# Patient Record
Sex: Female | Born: 1985 | Race: Black or African American | Hispanic: No | Marital: Single | Smoking: Former smoker
Health system: Southern US, Community
[De-identification: ages and names within clinical notes are randomized; demographics above are authoritative.]

## PROBLEM LIST (undated history)

## (undated) DIAGNOSIS — N189 Chronic kidney disease, unspecified: Secondary | ICD-10-CM

## (undated) DIAGNOSIS — R51 Headache: Secondary | ICD-10-CM

## (undated) HISTORY — PX: TUBAL LIGATION: SHX77

---
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
## 2009-11-17 ENCOUNTER — Other Ambulatory Visit: Admission: RE | Admit: 2009-11-17 | Discharge: 2009-11-17 | Payer: Self-pay | Admitting: *Deleted

## 2009-11-17 ENCOUNTER — Other Ambulatory Visit
Admission: RE | Admit: 2009-11-17 | Discharge: 2009-11-17 | Payer: Self-pay | Source: Home / Self Care | Admitting: Internal Medicine

## 2010-08-24 ENCOUNTER — Other Ambulatory Visit (HOSPITAL_COMMUNITY)
Admission: RE | Admit: 2010-08-24 | Discharge: 2010-08-24 | Disposition: A | Discharge status: Home / Self Care | Payer: BC Managed Care – PPO | Source: Ambulatory Visit | Attending: Obstetrics and Gynecology | Admitting: Obstetrics and Gynecology

## 2010-08-24 DIAGNOSIS — Z124 Encounter for screening for malignant neoplasm of cervix: Secondary | ICD-10-CM | POA: Insufficient documentation

## 2010-08-24 DIAGNOSIS — Z113 Encounter for screening for infections with a predominantly sexual mode of transmission: Secondary | ICD-10-CM | POA: Insufficient documentation

## 2010-08-24 DIAGNOSIS — Z1159 Encounter for screening for other viral diseases: Secondary | ICD-10-CM | POA: Insufficient documentation

## 2010-11-26 ENCOUNTER — Other Ambulatory Visit: Payer: Self-pay | Admitting: Obstetrics and Gynecology

## 2010-12-28 ENCOUNTER — Encounter (HOSPITAL_COMMUNITY): Payer: Self-pay | Admitting: Pharmacist

## 2011-01-14 ENCOUNTER — Encounter (HOSPITAL_COMMUNITY): Payer: Self-pay

## 2011-01-14 ENCOUNTER — Encounter (HOSPITAL_COMMUNITY)
Admission: RE | Admit: 2011-01-14 | Discharge: 2011-01-14 | Disposition: A | Discharge status: Home / Self Care | Payer: BC Managed Care – PPO | Source: Ambulatory Visit | Attending: Obstetrics and Gynecology | Admitting: Obstetrics and Gynecology

## 2011-01-14 HISTORY — DX: Headache: R51

## 2011-01-14 LAB — CBC
HCT: 35.8 % — ABNORMAL LOW (ref 36.0–46.0)
MCHC: 32.4 g/dL (ref 30.0–36.0)
MCV: 90.6 fL (ref 78.0–100.0)
RDW: 13.7 % (ref 11.5–15.5)

## 2011-01-14 NOTE — Patient Instructions (Addendum)
   Your procedure is scheduled ZO:XWRUEAVW January 10th  Enter through the Main Entrance of [facility] at: 9am Pick up the phone at the desk and dial [phone] and inform us of your arrival.  Please call this number if you have any problems the morning of surgery: [phone]  Remember: Do not eat food after midnight: Wednesday Do not drink clear liquids after:midnight Wednesday Take these medicines the morning of surgery with a SIP OF WATER: none  Do not wear jewelry, make-up, or FINGER nail polish Do not wear lotions, powders, perfumes or deodorant. Do not shave 48 hours prior to surgery. Do not bring valuables to the hospital.    Patients discharged on the day of surgery will not be allowed to drive home.     Remember to use your hibiclens as instructed.Please shower with 1/2 bottle the evening before your surgery and the other 1/2 bottle the morning of surgery.

## 2011-01-15 NOTE — H&P (Addendum)
NAMEAVIENDHA, AZBELL            ACCOUNT NO.:  [id number]  MEDICAL RECORD NO.:  [id number]  LOCATION:  [facility]                         FACILITY:  [facility]  PHYSICIAN:  Janine Limbo, M.D.DATE OF BIRTH:  Mar 02, 1985  DATE OF ADMISSION:  01/17/2011 DATE OF DISCHARGE:                               HISTORY & PHYSICAL   HISTORY OF PRESENT ILLNESS:  The patient is a 26 year old female, para 1- 1-0-2, who presents for a diagnostic laparoscopy.  The patient has been followed at the [facility] of [facility].  She complains of increasing dyspareunia and pelvic pain.  The patient has a known history of endometriosis diagnosed by laparoscopy that was performed in [place] many years ago. Her evaluation in [place] included a negative gonorrhea and Chlamydia culture.  An ultrasound showed a 6.8 x 4.8 cm uterus.  The ovaries appeared normal.  No other masses were appreciated on ultrasound.  The patient was treated empirically with metronidazole and azithromycin. She reports that there was no improvement in her discomfort.  The patient has been treated in the past with Depo-Lupron.  The patient has had a cesarean delivery in the past.  OBSTETRICAL HISTORY:  The patient has had 1 full term and 1 preterm delivery.  She has a history of an incompetent cervix.  DRUG ALLERGIES:  PENICILLIN.  PAST SURGERY HISTORY:  The patient has had her wisdom teeth removed and laparoscopy as mentioned above.  The patient has had 2 cesarean deliveries as well as a tubal sterilization procedure.  CURRENT MEDICATIONS:  Topamax which she takes for headaches.  She has a past history of asthma.  SOCIAL HISTORY:  The patient drinks alcohol socially.  She denies cigarette use and other recreational drug uses.  REVIEW OF SYSTEMS:  Please see history of present illness.  FAMILY HISTORY:  Noncontributory.  PHYSICAL EXAMINATION:  VITAL SIGNS:  Her height is  5 feet 2 inches, weight is 147 pounds. HEENT:  Within normal limits. CHEST:  Clear. HEART:  Regular rate and rhythm. BREASTS:  Without masses. ABDOMEN:  Nontender. EXTREMITIES:  Grossly normal. NEUROLOGIC:  Normal. PELVIC:  External genitalia is normal.  Vagina is normal.  Cervix is nontender.  Uterus is normal size, shape, and consistency.  Adnexa, no masses.  ASSESSMENT: 1. Increasing dyspareunia. 2. Pelvic pain 3. History of endometriosis.  PLAN:  The patient will undergo a diagnostic laparoscopy.  She understands the indications for her surgical procedure and she accepts the risks of, but not limited to, anesthetic complications, bleeding, infections, and possible damage to surrounding organs.  She understands that no guarantees can be given concerning the total relief of her discomfort.     Janine Limbo, M.D.     AVS/MEDQ  D:  01/14/2011  T:  01/15/2011  Job:  [id number]

## 2011-01-17 ENCOUNTER — Encounter (HOSPITAL_COMMUNITY): Payer: Self-pay | Admitting: Anesthesiology

## 2011-01-17 ENCOUNTER — Encounter (HOSPITAL_COMMUNITY)
Admission: RE | Disposition: A | Discharge status: Home / Self Care | Payer: Self-pay | Source: Ambulatory Visit | Attending: Obstetrics and Gynecology

## 2011-01-17 ENCOUNTER — Encounter (HOSPITAL_COMMUNITY): Payer: Self-pay | Admitting: *Deleted

## 2011-01-17 ENCOUNTER — Ambulatory Visit (HOSPITAL_COMMUNITY)
Admission: RE | Admit: 2011-01-17 | Discharge: 2011-01-17 | Disposition: A | Discharge status: Home / Self Care | Payer: BC Managed Care – PPO | Source: Ambulatory Visit | Attending: Obstetrics and Gynecology | Admitting: Obstetrics and Gynecology

## 2011-01-17 ENCOUNTER — Ambulatory Visit (HOSPITAL_COMMUNITY): Payer: BC Managed Care – PPO | Admitting: Anesthesiology

## 2011-01-17 DIAGNOSIS — N736 Female pelvic peritoneal adhesions (postinfective): Secondary | ICD-10-CM | POA: Insufficient documentation

## 2011-01-17 DIAGNOSIS — Z9851 Tubal ligation status: Secondary | ICD-10-CM | POA: Insufficient documentation

## 2011-01-17 DIAGNOSIS — N803 Endometriosis of pelvic peritoneum, unspecified: Secondary | ICD-10-CM | POA: Insufficient documentation

## 2011-01-17 DIAGNOSIS — D649 Anemia, unspecified: Secondary | ICD-10-CM | POA: Insufficient documentation

## 2011-01-17 DIAGNOSIS — N949 Unspecified condition associated with female genital organs and menstrual cycle: Secondary | ICD-10-CM | POA: Insufficient documentation

## 2011-01-17 DIAGNOSIS — IMO0002 Reserved for concepts with insufficient information to code with codable children: Secondary | ICD-10-CM | POA: Insufficient documentation

## 2011-01-17 HISTORY — PX: LAPAROSCOPY: SHX197

## 2011-01-17 SURGERY — LAPAROTOMY, FOR LYSIS OF ADHESIONS
Anesthesia: General | Site: Abdomen | Wound class: Clean Contaminated

## 2011-01-17 MED ORDER — PROMETHAZINE HCL 12.5 MG PO TABS: 12.5000 mg | ORAL_TABLET | Freq: Four times a day (QID) | ORAL | Status: AC | PRN

## 2011-01-17 MED ORDER — NEOSTIGMINE METHYLSULFATE 1 MG/ML IJ SOLN
INTRAMUSCULAR | Status: DC | PRN
  Administered 2011-01-17: 2 mg via INTRAVENOUS

## 2011-01-17 MED ORDER — LACTATED RINGERS IV SOLN
INTRAVENOUS | Status: DC
  Administered 2011-01-17 (×3): via INTRAVENOUS

## 2011-01-17 MED ORDER — ONDANSETRON HCL 4 MG/2ML IJ SOLN
INTRAMUSCULAR | Status: DC | PRN
  Administered 2011-01-17: 4 mg via INTRAVENOUS

## 2011-01-17 MED ORDER — OXYCODONE-ACETAMINOPHEN 5-325 MG PO TABS
ORAL_TABLET | ORAL | Status: AC
  Administered 2011-01-17: 1 via ORAL
  Filled 2011-01-17: qty 1

## 2011-01-17 MED ORDER — KETOROLAC TROMETHAMINE 30 MG/ML IJ SOLN
INTRAMUSCULAR | Status: DC | PRN
  Administered 2011-01-17: 30 mg via INTRAVENOUS
  Administered 2011-01-17: 30 mg via INTRAMUSCULAR

## 2011-01-17 MED ORDER — DEXAMETHASONE SODIUM PHOSPHATE 4 MG/ML IJ SOLN
INTRAMUSCULAR | Status: DC | PRN
  Administered 2011-01-17: 6 mg via INTRAVENOUS

## 2011-01-17 MED ORDER — FENTANYL CITRATE 0.05 MG/ML IJ SOLN
INTRAMUSCULAR | Status: DC | PRN
  Administered 2011-01-17: 100 ug via INTRAVENOUS
  Administered 2011-01-17: 50 ug via INTRAVENOUS
  Administered 2011-01-17 (×2): 100 ug via INTRAVENOUS

## 2011-01-17 MED ORDER — LIDOCAINE HCL (CARDIAC) 20 MG/ML IV SOLN
INTRAVENOUS | Status: AC
  Filled 2011-01-17: qty 5

## 2011-01-17 MED ORDER — IBUPROFEN 200 MG PO TABS: 800.0000 mg | ORAL_TABLET | Freq: Three times a day (TID) | ORAL | Status: AC | PRN

## 2011-01-17 MED ORDER — FENTANYL CITRATE 0.05 MG/ML IJ SOLN
INTRAMUSCULAR | Status: AC
  Administered 2011-01-17: 25 ug via INTRAVENOUS
  Filled 2011-01-17: qty 2

## 2011-01-17 MED ORDER — METOCLOPRAMIDE HCL 5 MG/ML IJ SOLN: 10.0000 mg | Freq: Once | INTRAMUSCULAR | Status: DC | PRN

## 2011-01-17 MED ORDER — ONDANSETRON HCL 4 MG/2ML IJ SOLN
INTRAMUSCULAR | Status: AC
  Filled 2011-01-17: qty 2

## 2011-01-17 MED ORDER — OXYCODONE-ACETAMINOPHEN 5-325 MG PO TABS: 1.0000 | ORAL_TABLET | ORAL | Status: AC | PRN

## 2011-01-17 MED ORDER — GLYCOPYRROLATE 0.2 MG/ML IJ SOLN
INTRAMUSCULAR | Status: AC
  Filled 2011-01-17: qty 2

## 2011-01-17 MED ORDER — FENTANYL CITRATE 0.05 MG/ML IJ SOLN
INTRAMUSCULAR | Status: AC
  Filled 2011-01-17: qty 5

## 2011-01-17 MED ORDER — GLYCOPYRROLATE 0.2 MG/ML IJ SOLN
INTRAMUSCULAR | Status: DC | PRN
  Administered 2011-01-17: .8 mg via INTRAVENOUS

## 2011-01-17 MED ORDER — MIDAZOLAM HCL 2 MG/2ML IJ SOLN
INTRAMUSCULAR | Status: AC
  Filled 2011-01-17: qty 2

## 2011-01-17 MED ORDER — MEPERIDINE HCL 25 MG/ML IJ SOLN
6.2500 mg | INTRAMUSCULAR | Status: DC | PRN
Start: 2011-01-17 — End: 2011-01-17

## 2011-01-17 MED ORDER — OXYCODONE-ACETAMINOPHEN 5-325 MG PO TABS
1.0000 | ORAL_TABLET | ORAL | Status: DC | PRN
  Administered 2011-01-17: 1 via ORAL

## 2011-01-17 MED ORDER — FENTANYL CITRATE 0.05 MG/ML IJ SOLN
INTRAMUSCULAR | Status: AC
  Filled 2011-01-17: qty 2

## 2011-01-17 MED ORDER — MIDAZOLAM HCL 5 MG/5ML IJ SOLN
INTRAMUSCULAR | Status: DC | PRN
  Administered 2011-01-17: 2 mg via INTRAVENOUS

## 2011-01-17 MED ORDER — LACTATED RINGERS IV SOLN
INTRAVENOUS | Status: DC | PRN
  Administered 2011-01-17: 10:00:00 via INTRAVENOUS

## 2011-01-17 MED ORDER — PROPOFOL 10 MG/ML IV EMUL
INTRAVENOUS | Status: AC
  Filled 2011-01-17: qty 20

## 2011-01-17 MED ORDER — BUPIVACAINE-EPINEPHRINE 0.5% -1:200000 IJ SOLN
INTRAMUSCULAR | Status: DC | PRN
  Administered 2011-01-17: 1 mL
  Administered 2011-01-17: 9 mL

## 2011-01-17 MED ORDER — DEXAMETHASONE SODIUM PHOSPHATE 10 MG/ML IJ SOLN
INTRAMUSCULAR | Status: AC
  Filled 2011-01-17: qty 1

## 2011-01-17 MED ORDER — ROCURONIUM BROMIDE 100 MG/10ML IV SOLN
INTRAVENOUS | Status: DC | PRN
  Administered 2011-01-17: 30 mg via INTRAVENOUS
  Administered 2011-01-17: 20 mg via INTRAVENOUS

## 2011-01-17 MED ORDER — PROPOFOL 10 MG/ML IV EMUL
INTRAVENOUS | Status: DC | PRN
  Administered 2011-01-17: 100 mg via INTRAVENOUS
  Administered 2011-01-17: 150 mg via INTRAVENOUS
  Administered 2011-01-17: 50 mg via INTRAVENOUS

## 2011-01-17 MED ORDER — LIDOCAINE HCL (CARDIAC) 20 MG/ML IV SOLN
INTRAVENOUS | Status: DC | PRN
  Administered 2011-01-17: 60 mg via INTRAVENOUS

## 2011-01-17 MED ORDER — NEOSTIGMINE METHYLSULFATE 1 MG/ML IJ SOLN
INTRAMUSCULAR | Status: AC
  Filled 2011-01-17: qty 10

## 2011-01-17 MED ORDER — ROCURONIUM BROMIDE 50 MG/5ML IV SOLN
INTRAVENOUS | Status: AC
  Filled 2011-01-17: qty 1

## 2011-01-17 MED ORDER — FENTANYL CITRATE 0.05 MG/ML IJ SOLN
25.0000 ug | INTRAMUSCULAR | Status: DC | PRN
  Administered 2011-01-17: 25 ug via INTRAVENOUS

## 2011-01-17 SURGICAL SUPPLY — 27 items
BANDAID FLEXIBLE 1X3 (GAUZE/BANDAGES/DRESSINGS) ×4 IMPLANT
BARRIER ADHS 3X4 INTERCEED (GAUZE/BANDAGES/DRESSINGS) ×4 IMPLANT
CABLE HIGH FREQUENCY MONO STRZ (ELECTRODE) IMPLANT
CHLORAPREP W/TINT 26ML (MISCELLANEOUS) ×4 IMPLANT
CLOTH BEACON ORANGE TIMEOUT ST (SAFETY) ×4 IMPLANT
DRSG COVADERM PLUS 2X2 (GAUZE/BANDAGES/DRESSINGS) ×4 IMPLANT
FORCEPS CUTTING 33CM 5MM (CUTTING FORCEPS) IMPLANT
FORCEPS CUTTING 45CM 5MM (CUTTING FORCEPS) IMPLANT
GLOVE BIOGEL PI IND STRL 8.5 (GLOVE) ×3 IMPLANT
GLOVE BIOGEL PI INDICATOR 8.5 (GLOVE) ×1
GLOVE ECLIPSE 8.0 STRL XLNG CF (GLOVE) ×8 IMPLANT
GOWN PREVENTION PLUS LG XLONG (DISPOSABLE) ×8 IMPLANT
GOWN PREVENTION PLUS XXLARGE (GOWN DISPOSABLE) ×4 IMPLANT
NS IRRIG 1000ML POUR BTL (IV SOLUTION) ×4 IMPLANT
PACK LAPAROSCOPY BASIN (CUSTOM PROCEDURE TRAY) ×4 IMPLANT
POUCH SPECIMEN RETRIEVAL 10MM (ENDOMECHANICALS) IMPLANT
RINGERS IRRIG 1000ML POUR BTL (IV SOLUTION) IMPLANT
SCALPEL HARMONIC ACE (MISCELLANEOUS) IMPLANT
SET IRRIG TUBING LAPAROSCOPIC (IRRIGATION / IRRIGATOR) IMPLANT
STRIP CLOSURE SKIN 1/4X3 (GAUZE/BANDAGES/DRESSINGS) ×4 IMPLANT
SUT MNCRL AB 4-0 PS2 18 (SUTURE) ×4 IMPLANT
SUT VIC AB 2-0 UR6 27 (SUTURE) IMPLANT
SUT VICRYL 0 ENDOLOOP (SUTURE) IMPLANT
SYR 50ML LL SCALE MARK (SYRINGE) IMPLANT
TOWEL OR 17X24 6PK STRL BLUE (TOWEL DISPOSABLE) ×8 IMPLANT
TRAY FOLEY CATH 14FR (SET/KITS/TRAYS/PACK) ×4 IMPLANT
WATER STERILE IRR 1000ML POUR (IV SOLUTION) ×4 IMPLANT

## 2011-01-17 NOTE — Anesthesia Procedure Notes (Signed)
Procedure Name: Intubation Date/Time: 01/17/2011 10:54 AM Performed by: Carlyle Lipa Pre-anesthesia Checklist: Patient identified, Patient being monitored, Timeout performed, Emergency Drugs available and Suction available Patient Re-evaluated:Patient Re-evaluated prior to inductionOxygen Delivery Method: Circle System Utilized, Ambu Bag, Simple face mask and Nasal Cannula Preoxygenation: Pre-oxygenation with 100% oxygen Intubation Type: IV induction Ventilation: Mask ventilation without difficulty Laryngoscope Size: Miller and 2 Grade View: Grade I Tube type: Oral Secured at: 21 cm Tube secured with: Tape Dental Injury: Teeth and Oropharynx as per pre-operative assessment

## 2011-01-17 NOTE — Anesthesia Postprocedure Evaluation (Signed)
Anesthesia Post Note  Patient: Joanne Mcknight  Procedure(s) Performed:  LYSIS OF ADHESION; LAPAROSCOPY DIAGNOSTIC - Removal of nonfunctioning clip  Anesthesia type: General  Patient location: PACU  Post pain: Pain level controlled  Post assessment: Post-op Vital signs reviewed  Last Vitals:  Filed Vitals:   01/17/11 1400  BP: 100/61  Pulse: 58  Temp:   Resp: 16    Post vital signs: Reviewed  Level of consciousness: sedated  Complications: No apparent anesthesia complicationsfj

## 2011-01-17 NOTE — H&P (Signed)
The patient was interviewed and examined today.  The previously documented history and physical examination was reviewed. There are no changes except for a diagnosis of anemia. The operative procedure was reviewed. The risks and benefits were outlined again. The specific risks include, but are not limited to, anesthetic complications, bleeding, infections, and possible damage to the surrounding organs. The patient's questions were answered.  We are ready to proceed as outlined. The likelihood of the patient achieving the goals of this procedure is very likely.   Leonard Schwartz, M.D.

## 2011-01-17 NOTE — OR Nursing (Signed)
Nonfunctioning clip removed and taken by physician to show pt. Post-op.Physician will discard.

## 2011-01-17 NOTE — Transfer of Care (Signed)
Immediate Anesthesia Transfer of Care Note  Patient: Joanne Mcknight  Procedure(s) Performed:  LYSIS OF ADHESION; LAPAROSCOPY DIAGNOSTIC - Removal of nonfunctioning clip  Patient Location: PACU  Anesthesia Type: General  Level of Consciousness: awake, alert  and oriented  Airway & Oxygen Therapy: Patient Spontanous Breathing and Patient connected to nasal cannula oxygen  Post-op Assessment: Report given to PACU RN and Post -op Vital signs reviewed and stable  Post vital signs: Reviewed and stable Filed Vitals:   01/17/11 0850  BP: 117/69  Pulse: 68  Temp: 36.7 C  Resp: 18    Complications: No apparent anesthesia complications

## 2011-01-17 NOTE — Anesthesia Preprocedure Evaluation (Signed)
Anesthesia Evaluation  Patient identified by MRN, date of birth, ID band Patient awake    Reviewed: Allergy & Precautions, H&P , NPO status , Patient's Chart, lab work & pertinent test results  Airway Mallampati: II TM Distance: >3 FB Neck ROM: full    Dental No notable dental hx. (+) Teeth Intact   Pulmonary asthma ,  clear to auscultation  Pulmonary exam normal       Cardiovascular regular Normal    Neuro/Psych  Headaches, Negative Psych ROS   GI/Hepatic negative GI ROS, Neg liver ROS,   Endo/Other  Negative Endocrine ROS  Renal/GU negative Renal ROS  Genitourinary negative   Musculoskeletal   Abdominal Normal abdominal exam  (+)   Peds  Hematology negative hematology ROS (+)   Anesthesia Other Findings   Reproductive/Obstetrics negative OB ROS                           Anesthesia Physical Anesthesia Plan  ASA: II  Anesthesia Plan: General ETT   Post-op Pain Management:    Induction:   Airway Management Planned:   Additional Equipment:   Intra-op Plan:   Post-operative Plan:   Informed Consent: I have reviewed the patients History and Physical, chart, labs and discussed the procedure including the risks, benefits and alternatives for the proposed anesthesia with the patient or authorized representative who has indicated his/her understanding and acceptance.   Dental Advisory Given  Plan Discussed with: Anesthesiologist, CRNA and Surgeon  Anesthesia Plan Comments:         Anesthesia Quick Evaluation

## 2011-01-17 NOTE — Op Note (Signed)
OPERATIVE NOTE  Joanne Mcknight  DOB:    02/09/1985  MRN:    [id number]  CSN:    [id number]  Date of Surgery:  01/17/2011  Preoperative Diagnosis:  Dyspareunia  Pelvic pain  Endometriosis  Anemia  Postoperative Diagnosis:  Dyspareunia  Pelvic pain  Endometriosis  On applied Filshie clip from the right adnexa  Anemia  Severe pelvic adhesions  Procedure:  Diagnostic laparoscopy  Laparoscopic lysis of adhesions  Laparoscopic removal of a Filshie clip  Surgeon:  Leonard Schwartz, M.D.  Assistant:  None  Anesthetic:  General  Disposition:  The patient is a 26 year old female who presents with the above-mentioned diagnosis. She has had 2 prior cesarean deliveries and a tubal sterilization procedure using Filshie clips. Pain medications and normal therapy has not relieved her discomfort. She was told in the past that she had endometriosis. The patient understands the indications for her surgical procedure and she accepts the risk of, but not limited to, anesthetic complications, bleeding, infections, and possible damage to surrounding organs. She understands that no guarantees can be given concerning believe of her pelvic pain.  Findings:  The uterus was normal size. There was a 3 cm band of adhesions between the fundus of the uterus and the anterior abdominal wall. There was a thick band of adhesions between the right round ligament and the right anterior abdominal wall. There were filmy adhesions between the anterior lower uterine segment and the anterior abdominal wall. The omentum was densely adhered to the anterior abdominal wall. Both ovaries appeared normal. The left fallopian tube was occluded by a Filshie clip. A small hydrosalpinx was present between the Filshie clip and the uterus. The right fallopian tube appeared normal. A Filshie clip was present and the posterior cul-de-sac at the right broad ligament. It was not attached to the fallopian tube.  The bowel and upper abdomen appeared normal. The posterior cul-de-sac was free of adhesions. There was no evidence of endometriosis present. Pictures were taken.  Procedure:  The patient was taken to the operating room where a general anesthetic was given. The patient's abdomen was prepped with ChloraPrep. The perineum and vagina were prepped with multiple layers of Betadine. A Foley catheter was placed in the bladder. Examination under anesthesia was performed. A Hulka tenaculum was placed inside the uterus. The patient was sterilely draped. The subumbilical area was injected with half percent Marcaine with epinephrine. An incision was made and carried sharply through the subcutaneous tissue, the fascia, and the anterior peritoneum. A pursestring suture was placed through the fascia. The Hassan cannula was sutured into place. A pneumoperitoneum was obtained. The laparoscope was inserted. Dense adhesions were encountered. The laparoscopic scissors, and the bipolar cautery were used to lyse the adhesions between the right round ligament and the right anterior abdominal wall. This allowed the uterus to be more mobile. The adhesions in the anterior cul-de-sac were used using a combination of sharp and blunt dissection. The right lower quadrant was injected with half percent Marcaine with epinephrine. A small incision was made and a 5 mm trocar was placed in the abdominal cavity under direct visualization. We then used a combination of sharp and blunt dissection to remove the omentum from the anterior abdominal wall. Care was taken not to damage the bowel. Once the omentum was removed and the anterior bowel wall we have much better visualization. The thick adhesions between the fundus of the uterus and the anterior abdominal wall was then injected with half percent Marcaine with epinephrine.  The adhesion was cauterized and then the laparoscopic scissors were used to remove the adhesion and to free the uterus. We  were able to then lyse all adhesions using a combination of blunt and sharp dissection. The pelvis was vigorously irrigated. The Filshie clip from the right fallopian tube (which was not on the fallopian tube) was then removed through the laparoscope. Interceed was placed on the uterus where the thick band of adhesions had been removed. The pelvis was carefully inspected and no other surgery was felt to be appropriate. The pneumoperitoneum was allowed to escape. All instruments were removed under direct visualization. The fascia was closed using 0 Vicryl. All skin incisions were closed using 3-0 Monocryl. Sponge, needle, and isthmic counts were correct on 2 occasions. The estimated blood loss was less than 30 cc. The patient was noted to drain clear yellow urine. She was awakened from her surgical procedure without difficulty and and transported to the recovery room in stable condition. She tolerated her procedure well.  Followup instructions:  The patient will return to see Dr. Stefano Gaul in 2 weeks. She was given a prescription for Motrin, Percocet, and Phenergan. She will call for questions or concerns.  Leonard Schwartz, M.D.

## 2011-01-21 ENCOUNTER — Encounter (HOSPITAL_COMMUNITY): Payer: Self-pay | Admitting: Obstetrics and Gynecology

## 2011-04-16 ENCOUNTER — Telehealth: Payer: Self-pay | Admitting: Obstetrics and Gynecology

## 2011-04-16 NOTE — Telephone Encounter (Signed)
Pt states had 1st Depo 02/07/2011.  Had bleeding x 14 days last month and 12 this month.   Changing pad 3-4x/day for comfort.   Explained may have irreg bleeding w/Depo.  Pt declines appt.  Will moniter x 1 more month and call if desires change in contraception.   To call if bleeding > pad per hr.   Pt verbalizes comprehension.

## 2011-10-22 ENCOUNTER — Encounter (HOSPITAL_BASED_OUTPATIENT_CLINIC_OR_DEPARTMENT_OTHER): Payer: Self-pay | Admitting: Family Medicine

## 2011-10-22 ENCOUNTER — Emergency Department (HOSPITAL_BASED_OUTPATIENT_CLINIC_OR_DEPARTMENT_OTHER)
Admission: EM | Admit: 2011-10-22 | Discharge: 2011-10-22 | Disposition: A | Discharge status: Home / Self Care | Payer: Medicaid Other | Attending: Emergency Medicine | Admitting: Emergency Medicine

## 2011-10-22 DIAGNOSIS — IMO0002 Reserved for concepts with insufficient information to code with codable children: Secondary | ICD-10-CM | POA: Insufficient documentation

## 2011-10-22 DIAGNOSIS — J45909 Unspecified asthma, uncomplicated: Secondary | ICD-10-CM | POA: Insufficient documentation

## 2011-10-22 DIAGNOSIS — X58XXXA Exposure to other specified factors, initial encounter: Secondary | ICD-10-CM | POA: Insufficient documentation

## 2011-10-22 DIAGNOSIS — Y93E6 Activity, residential relocation: Secondary | ICD-10-CM | POA: Insufficient documentation

## 2011-10-22 DIAGNOSIS — Z87891 Personal history of nicotine dependence: Secondary | ICD-10-CM | POA: Insufficient documentation

## 2011-10-22 DIAGNOSIS — Z880 Allergy status to penicillin: Secondary | ICD-10-CM | POA: Insufficient documentation

## 2011-10-22 DIAGNOSIS — S8392XA Sprain of unspecified site of left knee, initial encounter: Secondary | ICD-10-CM

## 2011-10-22 MED ORDER — OXYCODONE-ACETAMINOPHEN 5-325 MG PO TABS: 1.0000 | ORAL_TABLET | ORAL | Status: DC | PRN

## 2011-10-22 NOTE — ED Notes (Addendum)
Pt sts she "heard a pop" yesterday to right knee but no pain until last night. Pt c/o swelling and pain with bending and weight bearing to left knee.

## 2011-10-22 NOTE — ED Provider Notes (Signed)
History     CSN: [id number]  Arrival date & time 10/22/11  6295   First MD Initiated Contact with Patient 10/22/11 1016      Chief Complaint  Patient presents with  . Knee Pain     Patient is a 26 y.o. female presenting with knee pain. The history is provided by the patient.  Knee Pain This is a new problem. The current episode started yesterday. The problem occurs constantly. The problem has been gradually worsening. Pertinent negatives include no chest pain, no abdominal pain and no shortness of breath. The symptoms are aggravated by walking. The symptoms are relieved by rest. She has tried rest for the symptoms. The treatment provided mild relief.  pt reports she heard pop in left knee yesterday while moving furniture.   No falls/trauma to knee.  She reports pain/swelling in the knee.  Past Medical History  Diagnosis Date  . Headache   . Endometriosis present    pelvic pain  . Asthma     seasonal-uses albuterol inhaler prn-instructed to bring dos    Past Surgical History  Procedure Date  . Diagnostic laparoscopy 2004  . Cesarean section   . Tubal ligation   . Laparoscopy 01/17/2011    Procedure: LAPAROSCOPY DIAGNOSTIC;  Surgeon: Janine Limbo, MD;  Location: [facility];  Service: Gynecology;  Laterality: Bilateral;  Removal of nonfunctioning clip    No family history on file.  History  Substance Use Topics  . Smoking status: Former Smoker    Quit date: 10/14/2010  . Smokeless tobacco: Not on file  . Alcohol Use: Yes    OB History    Grav Para Term Preterm Abortions TAB SAB Ect Mult Living                  Review of Systems  Respiratory: Negative for shortness of breath.   Cardiovascular: Negative for chest pain.  Gastrointestinal: Negative for abdominal pain.    Allergies  Penicillins  Home Medications   Current Outpatient Rx  Name Route Sig Dispense Refill  . HYDROCODONE-ACETAMINOPHEN 5-500 MG PO TABS Oral Take 1 tablet by mouth every 6 (six)  hours as needed. For pain     . OXYCODONE-ACETAMINOPHEN 5-325 MG PO TABS Oral Take 1 tablet by mouth every 4 (four) hours as needed for pain. 5 tablet 0  . SUMATRIPTAN SUCCINATE 50 MG PO TABS Oral Take 50 mg by mouth every 2 (two) hours as needed. For migraines     . TOPIRAMATE 50 MG PO TABS Oral Take 50 mg by mouth 2 (two) times daily.      . TRAMADOL HCL 50 MG PO TABS Oral Take 50-100 mg by mouth every 6 (six) hours as needed. For pain. Maximum dose= 8 tablets per day       BP 117/81  Pulse 96  Temp 97.8 F (36.6 C) (Oral)  Resp 20  SpO2 100%  LMP 10/08/2011  Physical Exam CONSTITUTIONAL: Well developed/well nourished HEAD AND FACE: Normocephalic/atraumatic EYES: EOMI ENMT: Mucous membranes moist NECK: supple no meningeal signs CV: S1/S2 noted, no murmurs/rubs/gallops noted LUNGS: Lungs are clear to auscultation bilaterally, no apparent distress ABDOMEN: soft, nontender, no rebound or guarding NEURO: Pt is awake/alert, moves all extremitiesx4 EXTREMITIES: pulses normal, full ROM.  Pt has tenderness with ROM of left knee but no limitation in ROM, no edema noted.  No erythema noted.  No bony tenderness noted.  No tenderness to left ankle.  Distal pulses intact/equal SKIN: warm, color normal  PSYCH: no abnormalities of mood noted  ED Course  Procedures   1. Left knee sprain       MDM  Nursing notes including past medical history and social history reviewed and considered in documentation  No need for imaging as no trauma to knee, well appearing She reports she can bear weight.  Advised cruthces/NWB for one week and f/u with sports med.  Pt agreeable        Joya Gaskins, MD 10/22/11 1110

## 2013-04-07 ENCOUNTER — Emergency Department (HOSPITAL_BASED_OUTPATIENT_CLINIC_OR_DEPARTMENT_OTHER)
Admission: EM | Admit: 2013-04-07 | Discharge: 2013-04-08 | Disposition: A | Discharge status: Home / Self Care | Payer: Medicaid Other | Attending: Emergency Medicine | Admitting: Emergency Medicine

## 2013-04-07 DIAGNOSIS — Y939 Activity, unspecified: Secondary | ICD-10-CM | POA: Insufficient documentation

## 2013-04-07 DIAGNOSIS — Y929 Unspecified place or not applicable: Secondary | ICD-10-CM | POA: Insufficient documentation

## 2013-04-07 DIAGNOSIS — Z79899 Other long term (current) drug therapy: Secondary | ICD-10-CM | POA: Insufficient documentation

## 2013-04-07 DIAGNOSIS — J45909 Unspecified asthma, uncomplicated: Secondary | ICD-10-CM | POA: Insufficient documentation

## 2013-04-07 DIAGNOSIS — X58XXXA Exposure to other specified factors, initial encounter: Secondary | ICD-10-CM | POA: Insufficient documentation

## 2013-04-07 DIAGNOSIS — Z87891 Personal history of nicotine dependence: Secondary | ICD-10-CM | POA: Insufficient documentation

## 2013-04-07 DIAGNOSIS — Z880 Allergy status to penicillin: Secondary | ICD-10-CM | POA: Insufficient documentation

## 2013-04-07 DIAGNOSIS — Z8742 Personal history of other diseases of the female genital tract: Secondary | ICD-10-CM | POA: Insufficient documentation

## 2013-04-07 DIAGNOSIS — S83419A Sprain of medial collateral ligament of unspecified knee, initial encounter: Secondary | ICD-10-CM | POA: Insufficient documentation

## 2013-04-07 NOTE — ED Notes (Signed)
Pt sts pain in medial R leg, from just below knee to just above knee. Denies deformity, swelling, erythema.

## 2013-04-07 NOTE — ED Provider Notes (Signed)
CSN: [id number]     Arrival date & time 04/07/13  2142 History   First MD Initiated Contact with Patient 04/07/13 2356     Chief Complaint  Patient presents with  . Leg Pain     (Consider location/radiation/quality/duration/timing/severity/associated sxs/prior Treatment) HPI This is a 28 year old female with a two-day history of pain in her right knee. The pain is located along the medial aspect. The pain is moderate and worse with ambulation or palpation. She denies trauma to the knee. There is no associated swelling of the knee or leg. She describes the pain as sharp and stabbing.  Past Medical History  Diagnosis Date  . Headache   . Endometriosis present    pelvic pain  . Asthma     seasonal-uses albuterol inhaler prn-instructed to bring dos   Past Surgical History  Procedure Laterality Date  . Diagnostic laparoscopy  2004  . Cesarean section    . Tubal ligation    . Laparoscopy  01/17/2011    Procedure: LAPAROSCOPY DIAGNOSTIC;  Surgeon: Janine LimboArthur V Stringer, MD;  Location: [facility];  Service: Gynecology;  Laterality: Bilateral;  Removal of nonfunctioning clip   No family history on file. History  Substance Use Topics  . Smoking status: Former Smoker    Quit date: 10/14/2010  . Smokeless tobacco: Not on file  . Alcohol Use: Yes   OB History   Grav Para Term Preterm Abortions TAB SAB Ect Mult Living                 Review of Systems  All other systems reviewed and are negative.   Allergies  Penicillins  Home Medications   Current Outpatient Rx  Name  Route  Sig  Dispense  Refill  . HYDROcodone-acetaminophen (VICODIN) 5-500 MG per tablet   Oral   Take 1 tablet by mouth every 6 (six) hours as needed. For pain          . oxyCODONE-acetaminophen (PERCOCET/ROXICET) 5-325 MG per tablet   Oral   Take 1 tablet by mouth every 4 (four) hours as needed for pain.   5 tablet   0   . SUMAtriptan (IMITREX) 50 MG tablet   Oral   Take 50 mg by mouth every 2 (two) hours as  needed. For migraines          . topiramate (TOPAMAX) 50 MG tablet   Oral   Take 50 mg by mouth 2 (two) times daily.           . traMADol (ULTRAM) 50 MG tablet   Oral   Take 50-100 mg by mouth every 6 (six) hours as needed. For pain. Maximum dose= 8 tablets per day           BP 116/80  Pulse 89  Temp(Src) 97.3 F (36.3 C) (Oral)  Resp 16  Ht 5\' 2"  (1.575 m)  Wt 150 lb (68.04 kg)  BMI 27.43 kg/m2  SpO2 100%  Physical Exam General: Well-developed, well-nourished female in no acute distress; appearance consistent with age of record HENT: normocephalic; atraumatic Eyes: pupils equal, round and reactive to light; extraocular muscles intact Neck: supple Heart: regular rate and rhythm; no murmurs, rubs or gallops Lungs: clear to auscultation bilaterally Abdomen: soft; nondistended; nontender; no masses or hepatosplenomegaly; bowel sounds present Extremities: No deformity; full range of motion; pulses normal; tenderness over right medial collateral ligament of the knee without swelling, erythema, warmth or joint effusion; negative anterior and posterior drawer test of the right knee;  medial pain on lateral stress of the right knee Neurologic: Awake, alert and oriented; motor function intact in all extremities and symmetric; no facial droop Skin: Warm and dry Psychiatric: Normal mood and affect    ED Course  Procedures (including critical care time)   MDM  Patient placed in the sleeve and we'll treat with anti-inflammatory.    Hanley Seamen, MD 04/08/13 [phone]

## 2013-04-07 NOTE — ED Notes (Signed)
Pain to the back of rt leg in bend,  Pain comes and goes,  Sharpe at times and dull at times  Onset 3 days ago

## 2013-04-08 MED ORDER — NAPROXEN 250 MG PO TABS
500.0000 mg | ORAL_TABLET | Freq: Once | ORAL | Status: AC
  Administered 2013-04-08: 500 mg via ORAL
  Filled 2013-04-08: qty 2

## 2013-04-08 MED ORDER — NAPROXEN SODIUM 275 MG PO TABS: ORAL_TABLET | ORAL | Status: DC

## 2015-06-13 ENCOUNTER — Other Ambulatory Visit: Payer: Self-pay | Admitting: Obstetrics and Gynecology

## 2015-06-13 DIAGNOSIS — R102 Pelvic and perineal pain: Secondary | ICD-10-CM

## 2015-06-19 ENCOUNTER — Ambulatory Visit
Admission: RE | Admit: 2015-06-19 | Discharge: 2015-06-19 | Disposition: A | Discharge status: Home / Self Care | Payer: 59 | Source: Ambulatory Visit | Attending: Obstetrics and Gynecology | Admitting: Obstetrics and Gynecology

## 2015-06-19 DIAGNOSIS — R102 Pelvic and perineal pain: Secondary | ICD-10-CM

## 2015-06-19 MED ORDER — IOPAMIDOL (ISOVUE-300) INJECTION 61%
100.0000 mL | Freq: Once | INTRAVENOUS | Status: AC | PRN
  Administered 2015-06-19: 100 mL via INTRAVENOUS

## 2016-12-18 ENCOUNTER — Other Ambulatory Visit: Payer: Self-pay

## 2016-12-18 ENCOUNTER — Inpatient Hospital Stay (HOSPITAL_COMMUNITY)
Admission: AD | Admit: 2016-12-18 | Discharge: 2016-12-19 | Disposition: A | Discharge status: Home / Self Care | Payer: BC Managed Care – PPO | Source: Ambulatory Visit | Attending: Obstetrics and Gynecology | Admitting: Obstetrics and Gynecology

## 2016-12-18 ENCOUNTER — Encounter (HOSPITAL_COMMUNITY): Payer: Self-pay

## 2016-12-18 DIAGNOSIS — N7091 Salpingitis, unspecified: Secondary | ICD-10-CM | POA: Insufficient documentation

## 2016-12-18 DIAGNOSIS — Z880 Allergy status to penicillin: Secondary | ICD-10-CM | POA: Insufficient documentation

## 2016-12-18 DIAGNOSIS — Z87891 Personal history of nicotine dependence: Secondary | ICD-10-CM | POA: Diagnosis not present

## 2016-12-18 DIAGNOSIS — N7093 Salpingitis and oophoritis, unspecified: Secondary | ICD-10-CM

## 2016-12-18 DIAGNOSIS — N730 Acute parametritis and pelvic cellulitis: Secondary | ICD-10-CM | POA: Diagnosis not present

## 2016-12-18 DIAGNOSIS — Z3202 Encounter for pregnancy test, result negative: Secondary | ICD-10-CM | POA: Diagnosis not present

## 2016-12-18 DIAGNOSIS — R109 Unspecified abdominal pain: Secondary | ICD-10-CM | POA: Diagnosis present

## 2016-12-18 LAB — URINALYSIS, ROUTINE W REFLEX MICROSCOPIC
BACTERIA UA: NONE SEEN
BILIRUBIN URINE: NEGATIVE
Glucose, UA: NEGATIVE mg/dL
Ketones, ur: NEGATIVE mg/dL
Nitrite: NEGATIVE
Protein, ur: 30 mg/dL — AB
SPECIFIC GRAVITY, URINE: 1.028 (ref 1.005–1.030)
pH: 6 (ref 5.0–8.0)

## 2016-12-18 LAB — POCT PREGNANCY, URINE: Preg Test, Ur: NEGATIVE

## 2016-12-18 MED ORDER — AZITHROMYCIN 250 MG PO TABS
250.0000 mg | ORAL_TABLET | Freq: Every day | ORAL | 0 refills | Status: DC
Start: 2016-12-18 — End: 2020-07-21

## 2016-12-18 MED ORDER — FLUCONAZOLE 150 MG PO TABS: 150.0000 mg | ORAL_TABLET | Freq: Once | ORAL | 0 refills | Status: DC | PRN

## 2016-12-18 MED ORDER — DOXYCYCLINE HYCLATE 100 MG PO CAPS: 100.0000 mg | ORAL_CAPSULE | Freq: Two times a day (BID) | ORAL | 0 refills | Status: DC

## 2016-12-18 MED ORDER — HYDROCODONE-ACETAMINOPHEN 5-325 MG PO TABS: 1.0000 | ORAL_TABLET | Freq: Four times a day (QID) | ORAL | 0 refills | Status: DC | PRN

## 2016-12-18 MED ORDER — AZITHROMYCIN 250 MG PO TABS
500.0000 mg | ORAL_TABLET | Freq: Once | ORAL | Status: AC
  Administered 2016-12-18: 500 mg via ORAL
  Filled 2016-12-18: qty 2

## 2016-12-18 MED ORDER — IBUPROFEN 800 MG PO TABS
800.0000 mg | ORAL_TABLET | Freq: Once | ORAL | Status: AC
  Administered 2016-12-18: 800 mg via ORAL
  Filled 2016-12-18: qty 1

## 2016-12-18 MED ORDER — METRONIDAZOLE 500 MG PO TABS: 500.0000 mg | ORAL_TABLET | Freq: Two times a day (BID) | ORAL | 0 refills | Status: DC

## 2016-12-18 NOTE — MAU Provider Note (Signed)
Chief Complaint: Abdominal Pain   First Provider Initiated Contact with Patient 12/18/16 2251     SUBJECTIVE HPI: Joanne HausenJessica Mcknight is a 31 y.o. G2P2002  who presents to [facility] reporting abdominal pain. Was seen by her PCP earlier today. Had pelvic exam, labs, & imaging performed. Was told to contact her ob/gyn for treatment of suspected pyosalpinx & PID.  Denies fever/chills, n/v/d, vaginal discharge, or dysuria.  Patient is sexually active with 1 partner x 6 months & does not use condoms. States Dr. Stefano GaulStringer performed STI testing on her 3 months ago that were negative.   Location: lower abdomen, worse in LLQ Quality: sharp Severity: 5/10 on pain scale Duration: since yesterday Timing: constant Modifying factors: worse with movement    Past Medical History:  Diagnosis Date  . Asthma    seasonal-uses albuterol inhaler prn-instructed to bring dos  . Endometriosis present   pelvic pain  . Headache(784.0)    OB History  Gravida Para Term Preterm AB Living  2 2 2     2   SAB TAB Ectopic Multiple Live Births          2    # Outcome Date GA Lbr Len/2nd Weight Sex Delivery Anes PTL Lv  2 Term 06/20/07        LIV  1 Term 06/24/06        LIV     Past Surgical History:  Procedure Laterality Date  . CESAREAN SECTION    . DIAGNOSTIC LAPAROSCOPY  2004  . LAPAROSCOPY  01/17/2011   Procedure: LAPAROSCOPY DIAGNOSTIC;  Surgeon: Janine LimboArthur V Stringer, MD;  Location: [facility];  Service: Gynecology;  Laterality: Bilateral;  Removal of nonfunctioning clip  . TUBAL LIGATION     Social History   Socioeconomic History  . Marital status: Single    Spouse name: Not on file  . Number of children: Not on file  . Years of education: Not on file  . Highest education level: Not on file  Social Needs  . Financial resource strain: Not on file  . Food insecurity - worry: Not on file  . Food insecurity - inability: Not on file  . Transportation needs - medical: Not on file  .  Transportation needs - non-medical: Not on file  Occupational History  . Not on file  Tobacco Use  . Smoking status: Former Smoker    Last attempt to quit: 10/14/2010    Years since quitting: 6.1  . Smokeless tobacco: Former Engineer, [employer]  Substance and Sexual Activity  . Alcohol use: Yes    Comment: socially  . Drug use: No  . Sexual activity: Yes    Birth control/protection: Pill, Condom  Other Topics Concern  . Not on file  Social History Narrative  . Not on file   No current facility-administered medications on file prior to encounter.    Current Outpatient Medications on File Prior to Encounter  Medication Sig Dispense Refill  . drospirenone-ethinyl estradiol (YAZ,GIANVI,LORYNA) 3-0.02 MG tablet Take 1 tablet by mouth daily.     Allergies  Allergen Reactions  . Penicillins Hives    I have reviewed the past Medical Hx, Surgical Hx, Social Hx, Allergies and Medications.   Review of Systems  Constitutional: Negative for chills and fever.  Gastrointestinal: Positive for abdominal pain. Negative for constipation, diarrhea, nausea and vomiting.  Genitourinary: Negative.     OBJECTIVE Patient Vitals for the past 24 hrs:  BP Temp Temp src Pulse Resp SpO2 Height Weight  12/19/16  0020 118/68 98.8 F (37.1 C) Oral 79 18 - - -  12/18/16 2147 115/81 98.9 F (37.2 C) Oral (!) 111 20 94 % 5\' 2"  (1.575 m) 170 lb 0.6 oz (77.1 kg)   Constitutional: Well-developed, well-nourished female in no acute distress.  Cardiovascular: normal rate Respiratory: normal rate and effort.  GI: + TTP throughout lower abdomen. No rebound tenderness. BS present x 4.      Abdomen soft, no guarding or distension Neurologic: Alert and oriented x 4.  GU: pelvic exam deferred  LAB RESULTS Results for orders placed or performed during the hospital encounter of 12/18/16 (from the past 24 hour(s))  Urinalysis, Routine w reflex microscopic     Status: Abnormal   Collection Time: 12/18/16  9:33 PM  Result Value  Ref Range   Color, Urine YELLOW YELLOW   APPearance CLEAR CLEAR   Specific Gravity, Urine 1.028 1.005 - 1.030   pH 6.0 5.0 - 8.0   Glucose, UA NEGATIVE NEGATIVE mg/dL   Hgb urine dipstick MODERATE (A) NEGATIVE   Bilirubin Urine NEGATIVE NEGATIVE   Ketones, ur NEGATIVE NEGATIVE mg/dL   Protein, ur 30 (A) NEGATIVE mg/dL   Nitrite NEGATIVE NEGATIVE   Leukocytes, UA SMALL (A) NEGATIVE   RBC / HPF TOO NUMEROUS TO COUNT 0 - 5 RBC/hpf   WBC, UA 0-5 0 - 5 WBC/hpf   Bacteria, UA NONE SEEN NONE SEEN   Squamous Epithelial / LPF 0-5 (A) NONE SEEN   Mucus PRESENT    Hyaline Casts, UA PRESENT   Pregnancy, urine POC     Status: None   Collection Time: 12/18/16  9:41 PM  Result Value Ref Range   Preg Test, Ur NEGATIVE NEGATIVE    IMAGING No results found.  MDM UPT negative VSS, NAD Abdomen soft, no guarding, pt afebrile Per review of care everywhere -- GC/CT pending C/w Dr. Pennie Rushing. Will tx for PID & have pt f/u with Dr. Stefano Gaul next week. Patient allergic to Penicillin (hives) & unknown if taken cephalosporins. Will tx with flagyl, doxy, & azithromycin. First dose of azithromycin given in MAU prior to discharge.    ASSESSMENT 1. PID (acute pelvic inflammatory disease)   2. Left pyosalpinx     PLAN Discharge home in stable condition. Discussed dosing of antibiotics No intercourse until f/u with ob/gyn Rx diflucan prn yeast infection d/t abx per patient request Discussed reasons to return to MAU vs nearest ED   Follow-up Information    Kirkland Hun, MD. Schedule an appointment as soon as possible for a visit.   Specialty:  Obstetrics and Gynecology Contact information: [address] [place] [address] [phone]          Allergies as of 12/19/2016      Reactions   Penicillins Hives      Medication List    STOP taking these medications   HYDROcodone-acetaminophen 5-500 MG tablet Commonly known as:  VICODIN Replaced by:   HYDROcodone-acetaminophen 5-325 MG tablet   naproxen sodium 275 MG tablet Commonly known as:  ANAPROX   SUMAtriptan 50 MG tablet Commonly known as:  IMITREX   topiramate 50 MG tablet Commonly known as:  TOPAMAX   traMADol 50 MG tablet Commonly known as:  ULTRAM     TAKE these medications   azithromycin 250 MG tablet Commonly known as:  ZITHROMAX Take 1 tablet (250 mg total) by mouth daily. Take first 2 tablets together, then 1 every day until finished.   doxycycline 100 MG  capsule Commonly known as:  VIBRAMYCIN Take 1 capsule (100 mg total) by mouth 2 (two) times daily.   drospirenone-ethinyl estradiol 3-0.02 MG tablet Commonly known as:  YAZ,GIANVI,LORYNA Take 1 tablet by mouth daily.   fluconazole 150 MG tablet Commonly known as:  DIFLUCAN Take 1 tablet (150 mg total) by mouth once as needed for up to 1 dose (yeast infection).   HYDROcodone-acetaminophen 5-325 MG tablet Commonly known as:  NORCO/VICODIN Take 1-2 tablets by mouth every 6 (six) hours as needed. Replaces:  HYDROcodone-acetaminophen 5-500 MG tablet   metroNIDAZOLE 500 MG tablet Commonly known as:  FLAGYL Take 1 tablet (500 mg total) by mouth 2 (two) times daily.        Judeth HornLawrence, [place] Catarino, NP 12/19/2016  1:03 AM

## 2016-12-18 NOTE — MAU Note (Signed)
Pt states that last night she had pain in her abdomen that kept her awake. She made an appointment at her primary care today. While there they did a pelvic exam and u/s. He called her at [phone] tonight with the results and she states that he told her he was alarmed by the results and instructed her to come to the hospital to be seen.

## 2016-12-18 NOTE — Discharge Instructions (Signed)
Pelvic Inflammatory Disease °Pelvic inflammatory disease (PID) refers to an infection in some or all of the female organs. The infection can be in the uterus, ovaries, fallopian tubes, or the surrounding tissues in the pelvis. PID can cause abdominal or pelvic pain that comes on suddenly (acute pelvic pain). PID is a serious infection because it can lead to lasting (chronic) pelvic pain or the inability to have children (infertility). °What are the causes? °This condition is most often caused by an infection that is spread during sexual contact. However, the infection can also be caused by the normal bacteria that are found in the vaginal tissues if these bacteria travel upward into the reproductive organs. PID can also occur following: °· The birth of a baby. °· A miscarriage. °· An abortion. °· Major pelvic surgery. °· The use of an intrauterine device (IUD). °· A sexual assault. ° °What increases the risk? °This condition is more likely to develop in women who: °· Are younger than 32 years of age. °· Are sexually active at a young age. °· Use nonbarrier contraception. °· Have multiple sexual partners. °· Have sex with someone who has symptoms of an STD (sexually transmitted disease). °· Use oral contraception. ° °At times, certain behaviors can also increase the possibility of getting PID, such as: °· Using a vaginal douche. °· Having an IUD in place. ° °What are the signs or symptoms? °Symptoms of this condition include: °· Abdominal or pelvic pain. °· Fever. °· Chills. °· Abnormal vaginal discharge. °· Abnormal uterine bleeding. °· Unusual pain shortly after the end of a menstrual period. °· Painful urination. °· Pain with sexual intercourse. °· Nausea and vomiting. ° °How is this diagnosed? °To diagnose this condition, your health care provider will do a physical exam and take your medical history. A pelvic exam typically reveals great tenderness in the uterus and the surrounding pelvic tissues. You may also  have tests, such as: °· Lab tests, including a pregnancy test, blood tests, and urine test. °· Culture tests of the vagina and cervix to check for an STD. °· Ultrasound. °· A laparoscopic procedure to look inside the pelvis. °· Examining vaginal secretions under a microscope. ° °How is this treated? °Treatment for this condition may involve one or more approaches. °· Antibiotic medicines may be prescribed to be taken by mouth. °· Sexual partners may need to be treated if the infection is caused by an STD. °· For more severe cases, hospitalization may be needed to give antibiotics directly into a vein through an IV tube. °· Surgery may be needed if other treatments do not help, but this is rare. ° °It may take weeks until you are completely well. If you are diagnosed with PID, you should also be checked for human immunodeficiency virus (HIV). Your health care provider may test you for infection again 3 months after treatment. You should not have unprotected sex. °Follow these instructions at home: °· Take over-the-counter and prescription medicines only as told by your health care provider. °· If you were prescribed an antibiotic medicine, take it as told by your health care provider. Do not stop taking the antibiotic even if you start to feel better. °· Do not have sexual intercourse until treatment is completed or as told by your health care provider. If PID is confirmed, your recent sexual partners will need treatment, especially if you had unprotected sex. °· Keep all follow-up visits as told by your health care provider. This is important. °Contact a health care   provider if: °· You have increased or abnormal vaginal discharge. °· Your pain does not improve. °· You vomit. °· You have a fever. °· You cannot tolerate your medicines. °· Your partner has an STD. °· You have pain when you urinate. °Get help right away if: °· You have increased abdominal or pelvic pain. °· You have chills. °· Your symptoms are not  better in 72 hours even with treatment. °This information is not intended to replace advice given to you by your health care provider. Make sure you discuss any questions you have with your health care provider. °Document Released: 12/24/2004 Document Revised: 06/01/2015 Document Reviewed: 01/31/2014 °Elsevier Interactive Patient Education © 2018 [employer] ° °

## 2016-12-20 ENCOUNTER — Telehealth: Payer: Self-pay | Admitting: Obstetrics and Gynecology

## 2016-12-20 NOTE — Telephone Encounter (Signed)
TC received from patient requesting to have a note to be out of work today, because she is "taking al of these abx and feeling very sick". Patient was seen in MAU on 12/12, dx'd with PID and given abx tx. Note read from MAU provider -- note does not state that patient needs to be out of work for any reason. Advised patient that the medications she was Rx'd do not give a medical reason to take someone out of work. Advised patient that since she is a GYN patient of  Dr. Stefano GaulStringer she can call his office for a note, if she feels she needs to be out of work today. Patient upset and hung the phone.  Raelyn MoraRolitta Lafonda Patron, CNM 12/20/2016 11:55 AM

## 2017-04-15 ENCOUNTER — Other Ambulatory Visit: Payer: Self-pay | Admitting: Obstetrics and Gynecology

## 2017-04-15 DIAGNOSIS — R102 Pelvic and perineal pain: Secondary | ICD-10-CM

## 2017-04-16 ENCOUNTER — Ambulatory Visit
Admission: RE | Admit: 2017-04-16 | Discharge: 2017-04-16 | Disposition: A | Discharge status: Home / Self Care | Payer: BC Managed Care – PPO | Source: Ambulatory Visit | Attending: Obstetrics and Gynecology | Admitting: Obstetrics and Gynecology

## 2017-04-16 DIAGNOSIS — R102 Pelvic and perineal pain: Secondary | ICD-10-CM

## 2017-10-28 IMAGING — CT CT ABD-PELV W/ CM
1 of 2 series · 15 of 32 positions shown, 19 images · IV contrast (APPLIED)
Comparison: None.

CLINICAL DATA: Patient with pelvic and perineal pain since [DATE].
History of prior cesarean section.

EXAM:
CT ABDOMEN AND PELVIS WITH CONTRAST
TECHNIQUE: Multidetector CT imaging of the abdomen and pelvis was performed
using the standard protocol following bolus administration of
intravenous contrast.
CONTRAST:  100mL G1ZAGQ-433 IOPAMIDOL (G1ZAGQ-433) INJECTION 61%

[Series 2: abd/pelvis w/cm · axial · 0.79mm/px · z∈[-423,+22]mm · 15 of 97 slices shown, 19 images]
[im 4/97  soft-tissue]
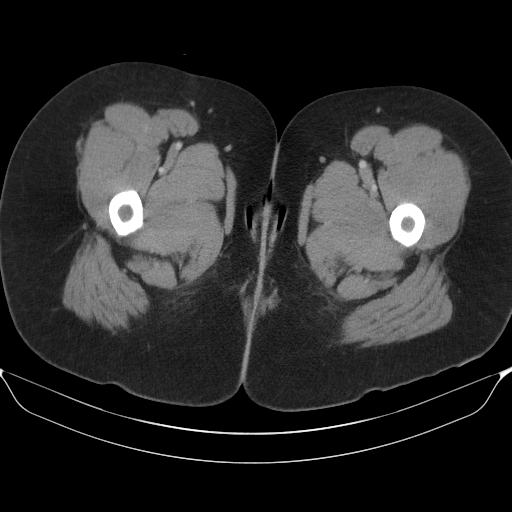
[im 4/97  bone]
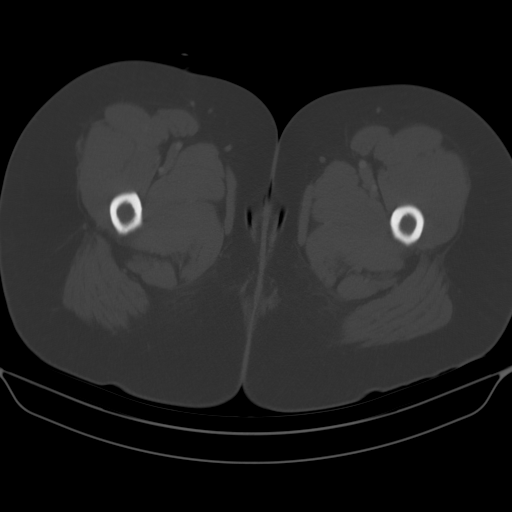
[im 12/97  soft-tissue]
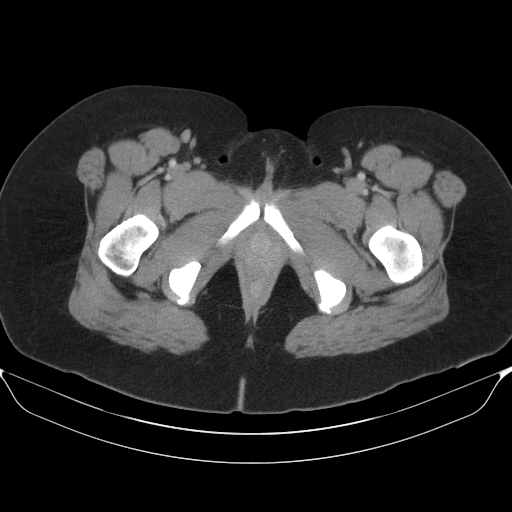
[im 20/97  soft-tissue]
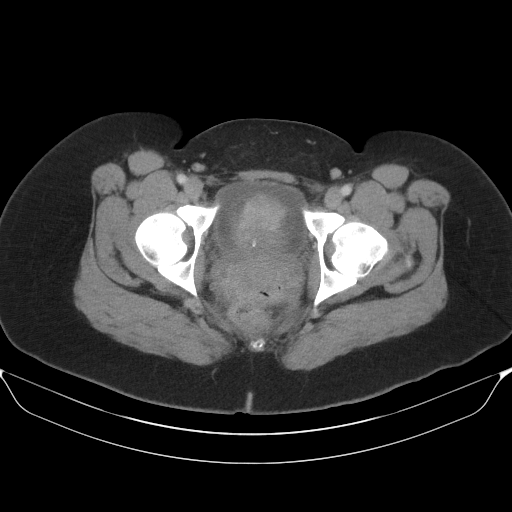
[im 27/97  soft-tissue]
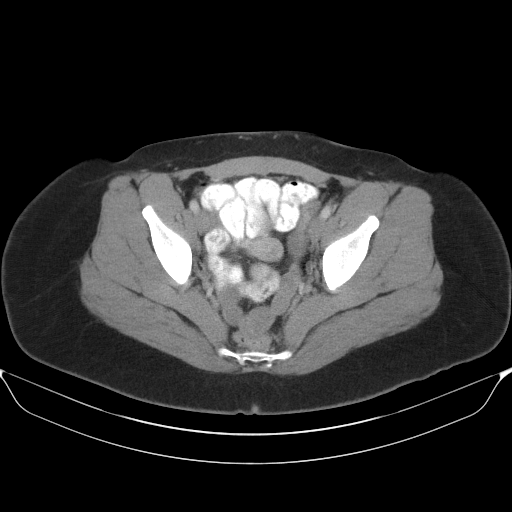
[im 35/97  soft-tissue]
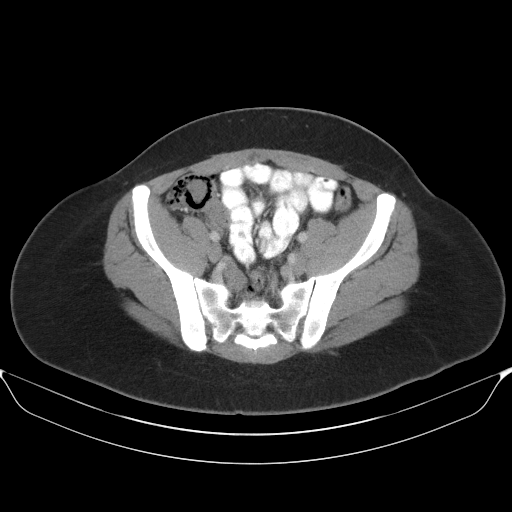
[im 43/97  soft-tissue]
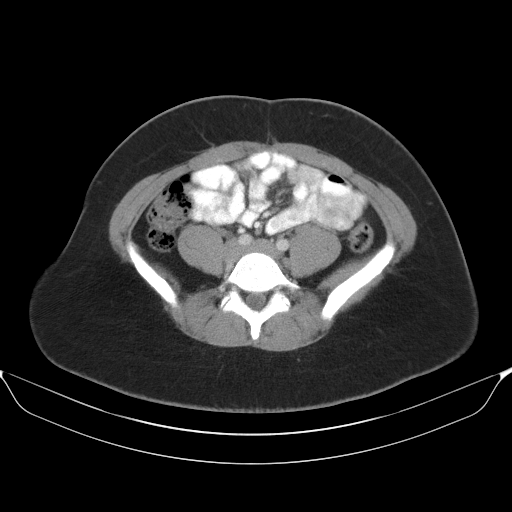
[im 50/97  soft-tissue]
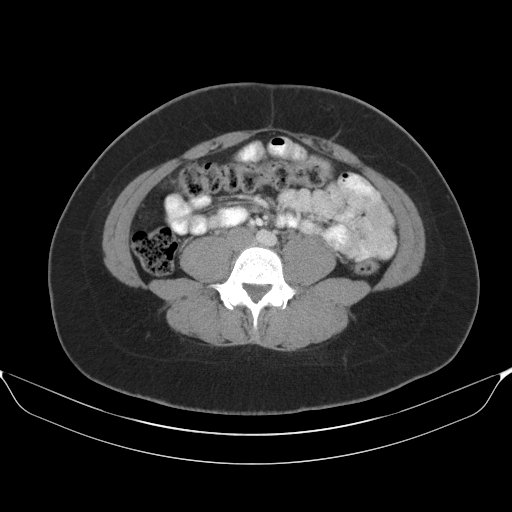
[im 54/97  soft-tissue]
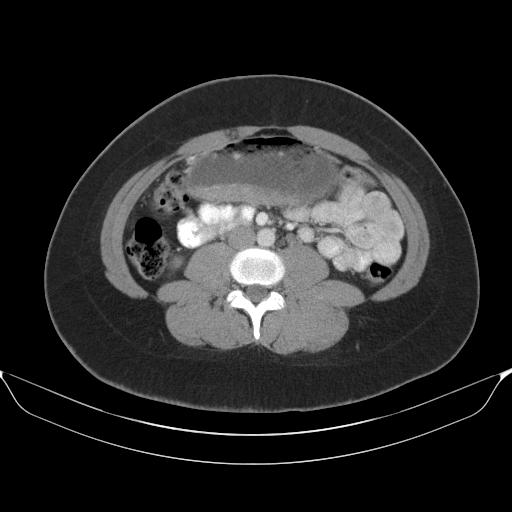
[im 62/97  soft-tissue]
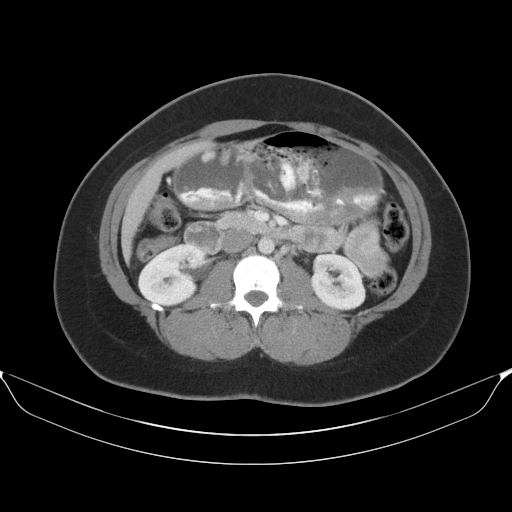
[im 62/97  bone]
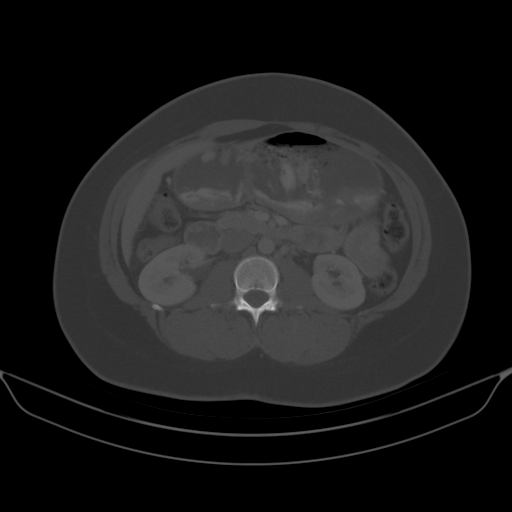
[im 70/97  soft-tissue]
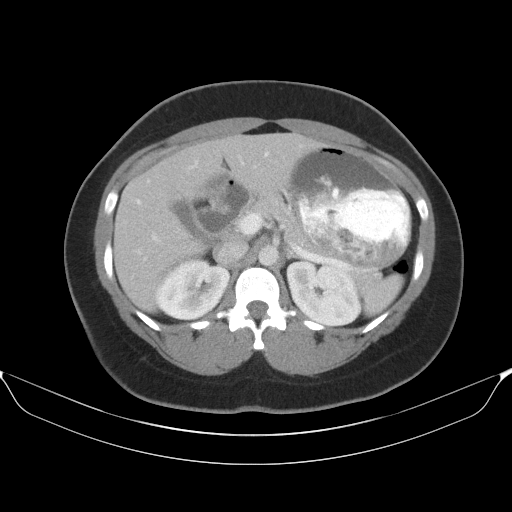
[im 77/97  soft-tissue]
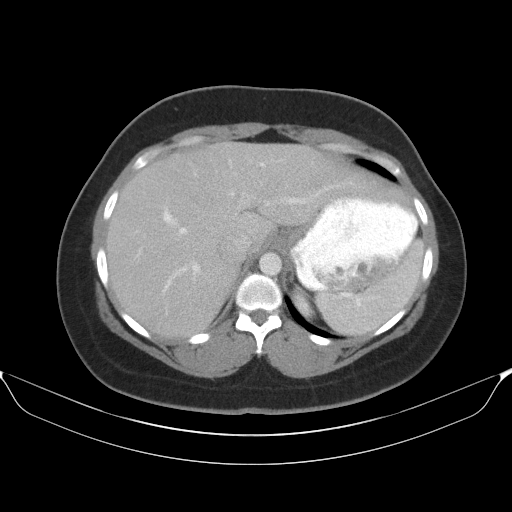
[im 81/97  lung]
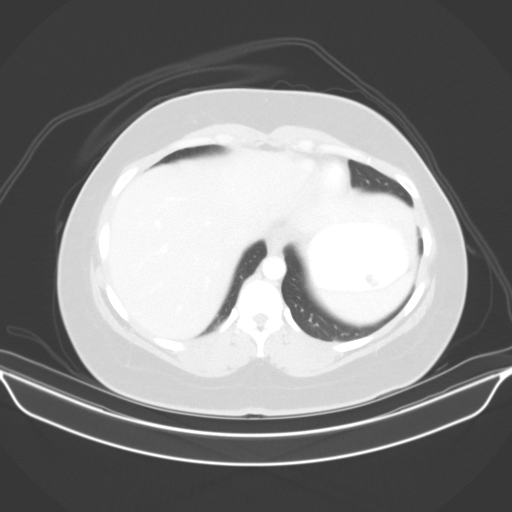
[im 85/97  soft-tissue]
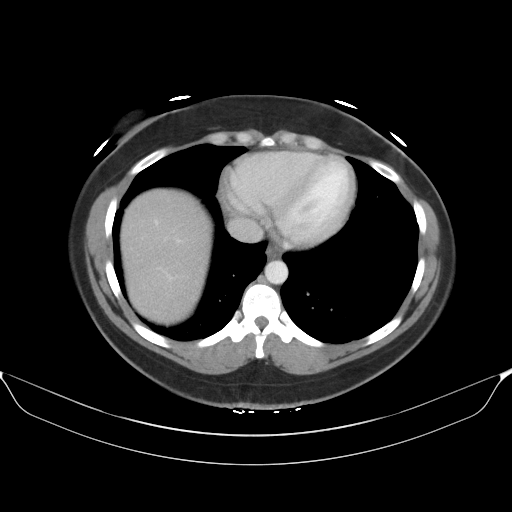
[im 85/97  lung]
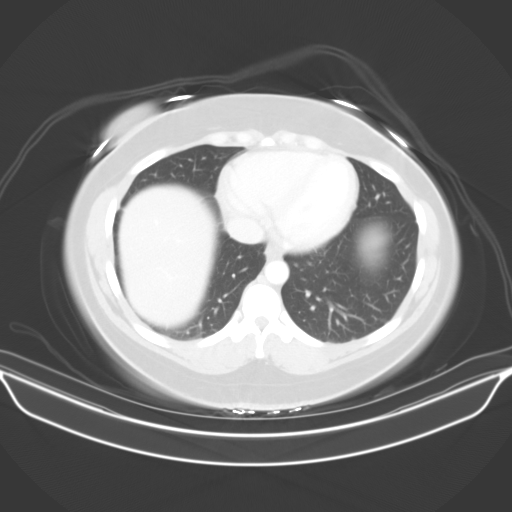
[im 89/97  lung]
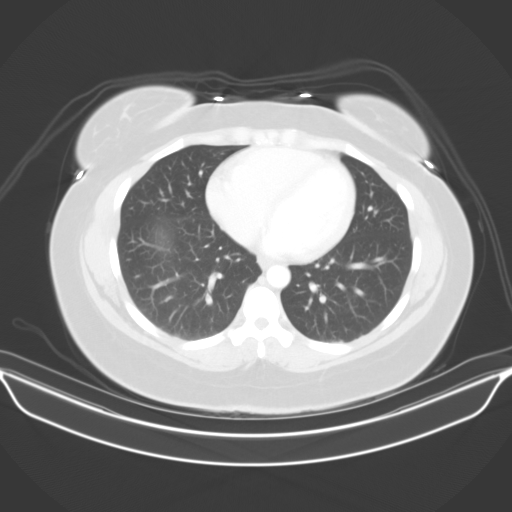
[im 93/97  soft-tissue]
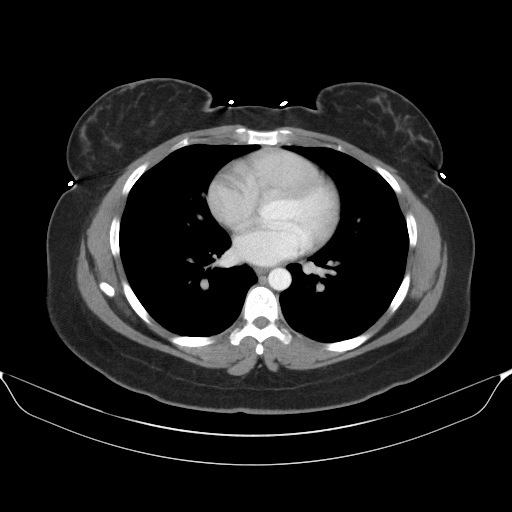
[im 93/97  lung]
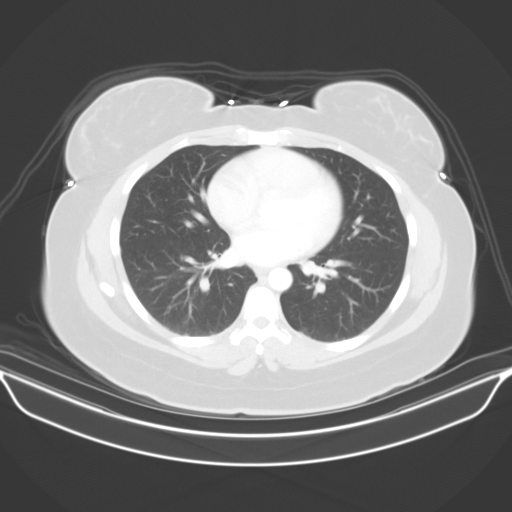

[15 of 32 positions shown; findings below may reference images not displayed]

FINDINGS: Lower chest:  Normal heart size.

Hepatobiliary: Liver is normal in size and contour. No focal hepatic
lesion is identified. Gallbladder is decompressed.

Pancreas: Unremarkable

Spleen: Unremarkable

Adrenals/Urinary Tract: Normal adrenal glands. Kidneys enhance
symmetrically with contrast. No hydronephrosis.

Stomach/Bowel: No abnormal bowel wall thickening or evidence for
bowel obstruction. No free fluid or free intraperitoneal air. Normal
morphology of the stomach. Normal appendix.

Vascular/Lymphatic: Normal caliber abdominal aorta. No
retroperitoneal lymphadenopathy.

Other: Intrauterine device is present. Small cyst within the left
adnexa.

Musculoskeletal: No aggressive or acute appearing osseous lesions.
IMPRESSION: No acute process within the abdomen or pelvis.

## 2019-03-13 ENCOUNTER — Other Ambulatory Visit: Payer: Self-pay

## 2019-03-13 ENCOUNTER — Ambulatory Visit: Payer: Self-pay | Attending: Internal Medicine

## 2019-03-13 DIAGNOSIS — Z23 Encounter for immunization: Secondary | ICD-10-CM | POA: Insufficient documentation

## 2019-03-13 NOTE — Progress Notes (Signed)
   Covid-19 Vaccination Clinic  Name:  Joanne Mcknight    MRN: [id number] DOB: July 22, 1985  03/13/2019  [name] was observed post Covid-19 immunization for 15 minutes without incident. She was provided with Vaccine Information Sheet and instruction to access the V-Safe system.   [name] was instructed to call 911 with any severe reactions post vaccine: Marland Kitchen Difficulty breathing  . Swelling of face and throat  . A fast heartbeat  . A bad rash all over body  . Dizziness and weakness   Immunizations Administered    Name Date Dose VIS Date Route   Moderna COVID-19 Vaccine 03/13/2019  4:24 PM 0.5 mL 12/08/2018 Intramuscular   Manufacturer: Moderna   Lot: [id number]   NDC: 59458-592-92

## 2019-04-10 ENCOUNTER — Ambulatory Visit: Payer: Self-pay | Attending: Internal Medicine

## 2019-04-10 DIAGNOSIS — Z23 Encounter for immunization: Secondary | ICD-10-CM

## 2019-04-10 NOTE — Progress Notes (Signed)
   Covid-19 Vaccination Clinic  Name:  Joanne Mcknight    MRN: [id number] DOB: September 09, 1985  04/10/2019  Ms. Joanne Mcknight was observed post Covid-19 immunization for 15 minutes without incident. She was provided with Vaccine Information Sheet and instruction to access the V-Safe system.   Ms. Joanne Mcknight was instructed to call 911 with any severe reactions post vaccine: Marland Kitchen Difficulty breathing  . Swelling of face and throat  . A fast heartbeat  . A bad rash all over body  . Dizziness and weakness   Immunizations Administered    Name Date Dose VIS Date Route   Moderna COVID-19 Vaccine 04/10/2019  9:33 AM 0.5 mL 12/08/2018 Intramuscular   Manufacturer: Moderna   Lot: [id number]   NDC: 53614-431-54

## 2019-12-23 IMAGING — US US PELVIS COMPLETE TRANSABD/TRANSVAG
1 series · 14 of 25 positions shown · non-contrast
Comparison: None

CLINICAL DATA: Pelvic and perineal pain

EXAM:
TRANSABDOMINAL AND TRANSVAGINAL ULTRASOUND OF PELVIS
TECHNIQUE: Both transabdominal and transvaginal ultrasound examinations of the
pelvis were performed. Transabdominal technique was performed for
global imaging of the pelvis including uterus, ovaries, adnexal
regions, and pelvic cul-de-sac. It was necessary to proceed with
endovaginal exam following the transabdominal exam to visualize the
endometrium and ovaries.

[Series 1: us pelvis complete transabd/transvag · 0.18mm/px · 14 of 88 slices shown]
[im 1/88]
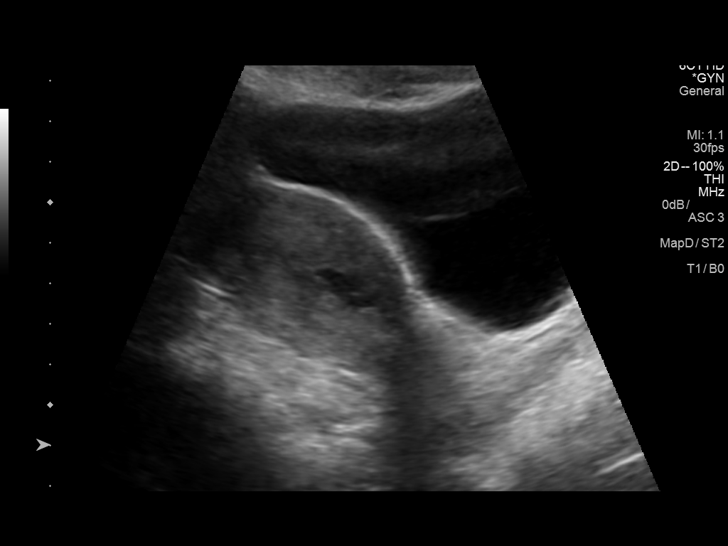
[im 8/88]
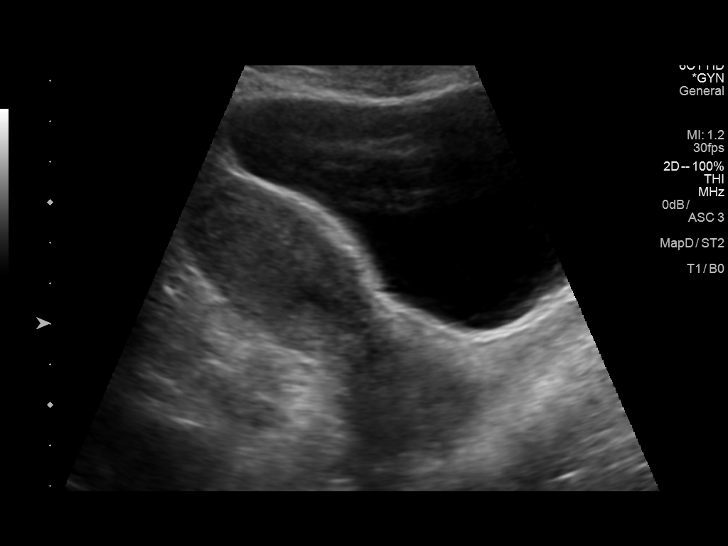
[im 15/88]
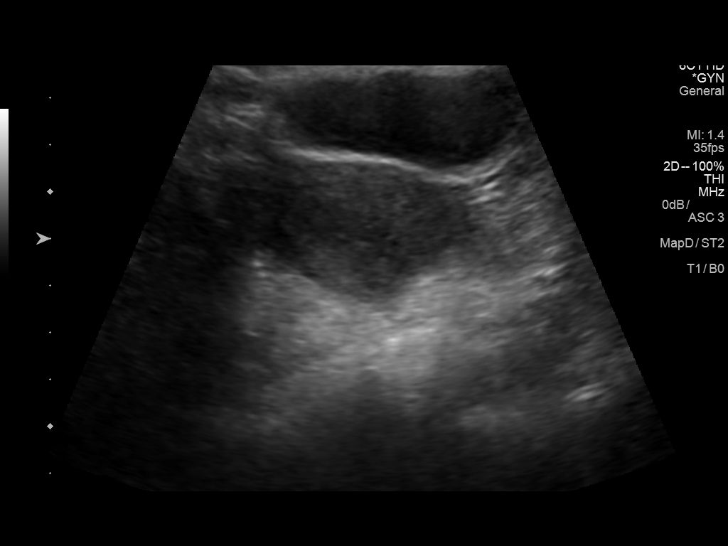
[im 22/88]
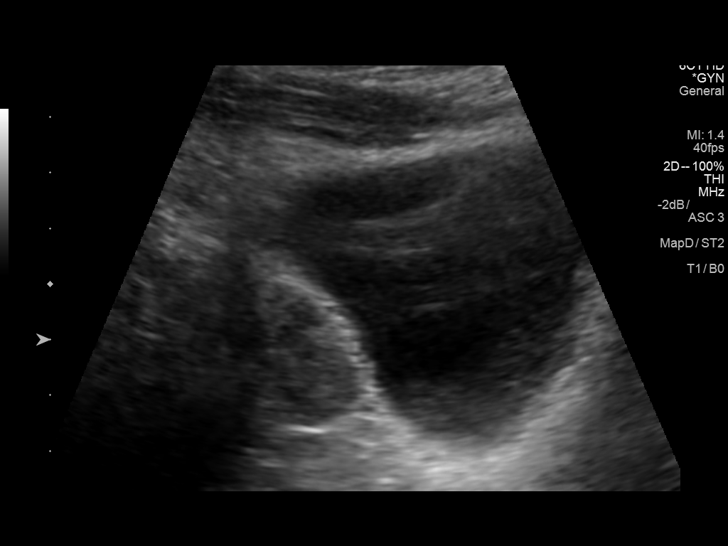
[im 30/88]
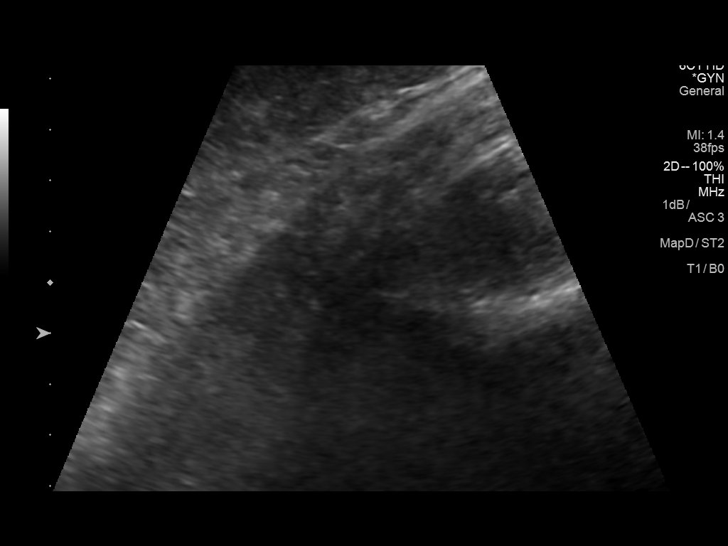
[im 33/88]
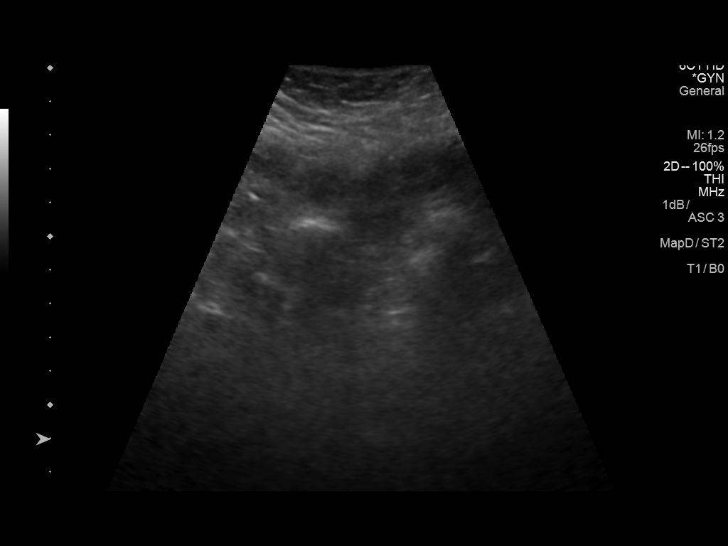
[im 40/88]
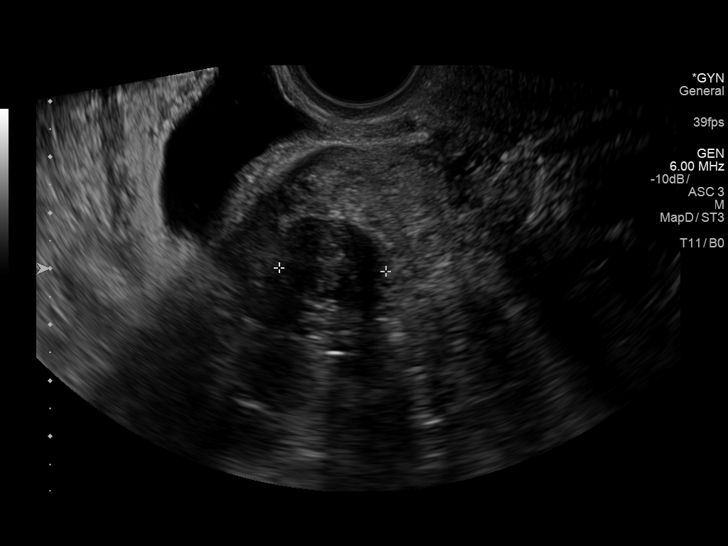
[im 48/88]
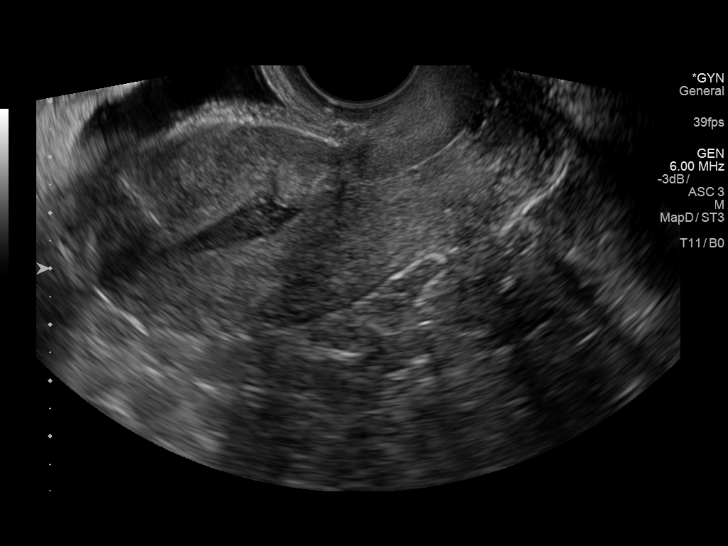
[im 55/88]
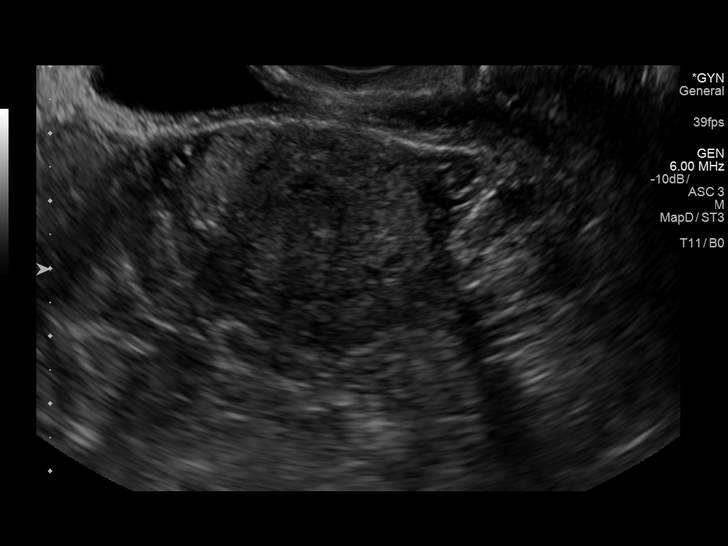
[im 59/88]
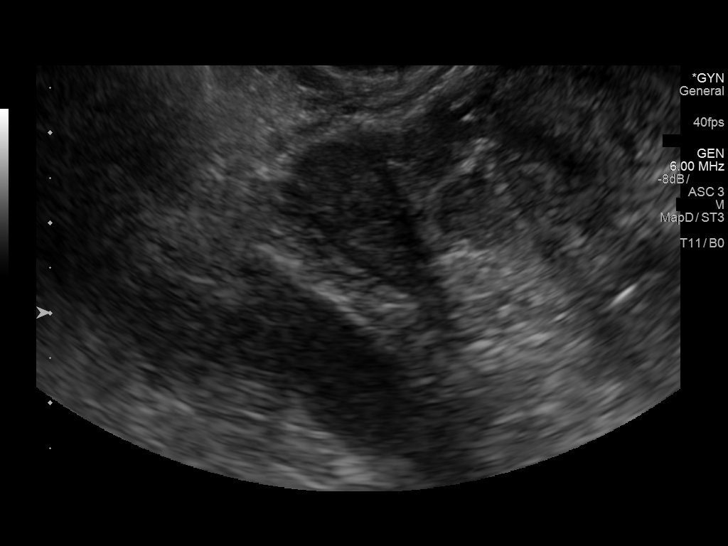
[im 66/88]
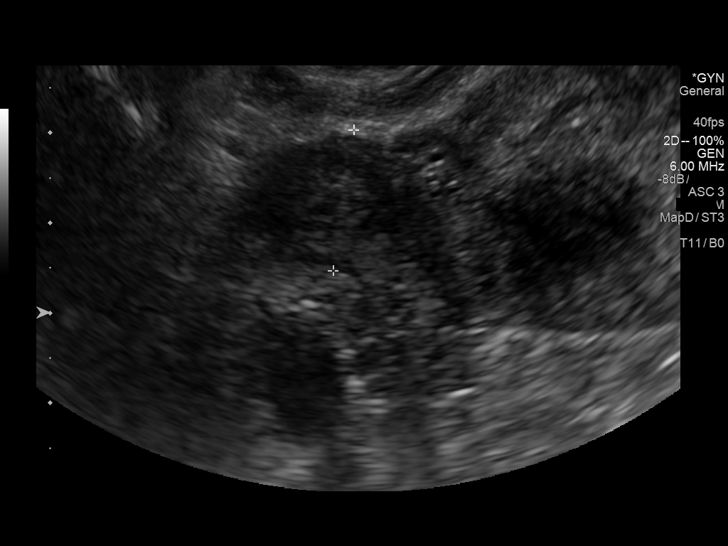
[im 73/88]
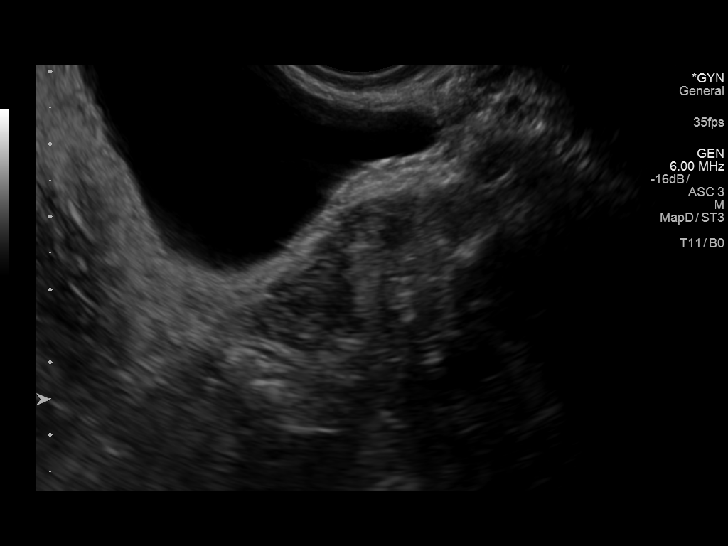
[im 80/88]
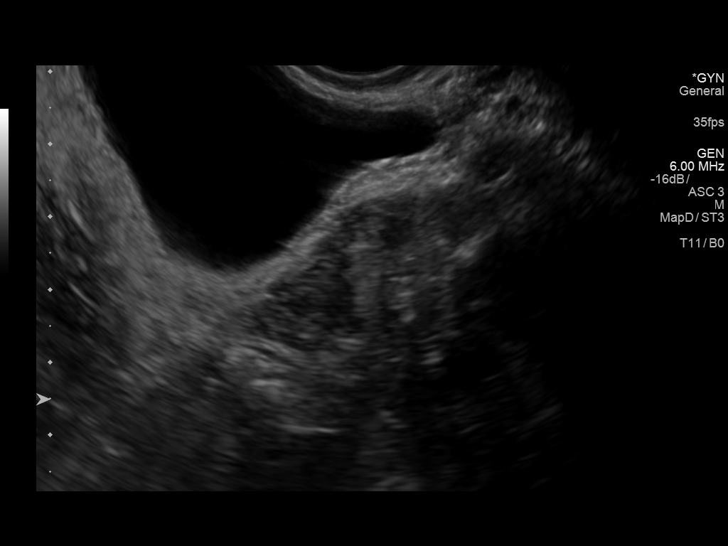
[im 88/88]
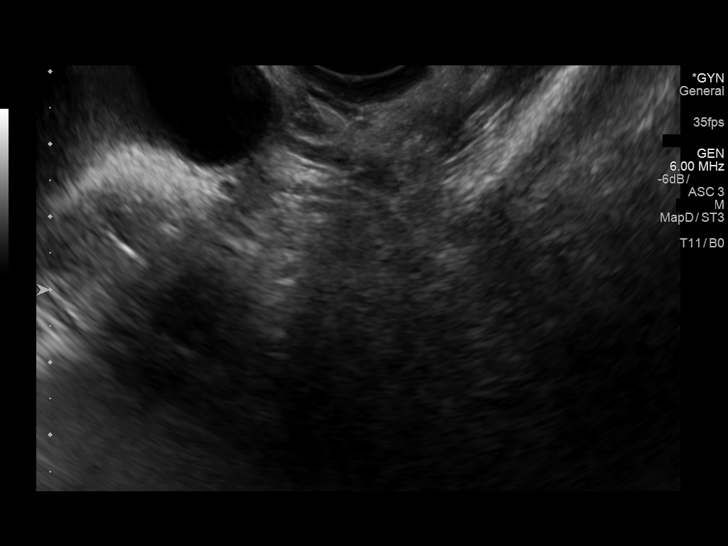

[14 of 25 positions shown; findings below may reference images not displayed]

FINDINGS: Uterus

Measurements: 8.8 x 3.7 x 4.2 cm.  1.9 cm posterior fundal fibroid

Endometrium

Thickness: 8 mm in thickness, with fluid in the endometrial cavity..
No focal abnormality visualized.

Right ovary

Measurements: 2.6 x 2.0 x 1.6 cm. Normal appearance/no adnexal mass.

Left ovary

Measurements: 3.9 x 2.1 x 1.7 cm. Normal appearance/no adnexal mass.

Other findings

No free fluid.
IMPRESSION: 1.9 cm posterior fundal fibroid.

No acute findings.

## 2020-07-21 ENCOUNTER — Encounter (HOSPITAL_BASED_OUTPATIENT_CLINIC_OR_DEPARTMENT_OTHER): Payer: Self-pay | Admitting: Obstetrics and Gynecology

## 2020-07-21 ENCOUNTER — Other Ambulatory Visit: Payer: Self-pay

## 2020-07-21 NOTE — Progress Notes (Addendum)
Spoke w/ via phone for pre-op interview---pt Lab needs dos----  none             Lab results------n/a COVID test -----patient states asymptomatic no test needed Arrive at -------0630 07/27/2020  NPO after MN NO Solid Food.  Clear liquids from MN until--- Med rec completed Medications to take morning of surgery -----none Diabetic medication -----n/a Patient instructed no nail polish to be worn day of surgery Patient instructed to bring photo id and insurance card day of surgery Patient aware to have Driver (ride ) / caregiver  Joanne Mcknight  for 24 hours after surgery  Patient Special Instructions -----bring name of birth control pill DOS Pre-Op special Istructions -----n/a Patient verbalized understanding of instructions that were given at this phone interview. Patient denies shortness of breath, chest pain, fever, cough at this phone interview.   Pt is followed by Neprology Dr. Elenore Rota in epic on 07/11/2020.  Followed  for ss-A antibody and SS-B antibody and elevated ANA.  Does not have lupus but does have sun sensitivity

## 2020-07-26 NOTE — H&P (Signed)
Joanne Mcknight is an 34 y.o. female G2P2 presenting for scheduled operative BTL, poss fulguration of endometriosis, hysteroscopy with novasure ablation.  Pt has a long h/o painful menses and former pt of Dr. Stefano Gaul. She had endometriosis diagnosed in her 6's by laparoscopy and has had multiple treatments in the past including OCP's, provera and lupron. She reports worsening menstrual flow for 6 years. She had a laparoscopy 2013 with Dr. Stefano Gaul and did not have obvious endometriosis then but did have adhesions and one of her filshie clips had fallen off her right tube tube and was removed. She went to her GYN in [place] and had an HSG which she says showed patent tubes, so she uses the OCP's for birth control as well as to control her sx. She is done with childbearing. She has regular menses with painful periods as well as nonmenstrual pain on the left. EMB and SIUS confirmed a normal cavity.  Pertinent Gynecological History: Menses: Heavy and painful  OB History: LTCS x 2   Menstrual History:  Patient's last menstrual period was 07/04/2020.    Past Medical History:  Diagnosis Date   Asthma    seasonal-uses albuterol inhaler prn-instructed to bring dos, Pt had an asthma attack om 6 months. 0715/2022   Chronic kidney disease    Micro blood in urine and is followed by nephrology   Endometriosis present   pelvic pain   Headache(784.0)     Past Surgical History:  Procedure Laterality Date   CESAREAN SECTION     x2 2008 and 2009   DIAGNOSTIC LAPAROSCOPY  01/07/2002   LAPAROSCOPY  01/17/2011   Procedure: LAPAROSCOPY DIAGNOSTIC;  Surgeon: Janine Limbo, MD;  Location: [facility];  Service: Gynecology;  Laterality: Bilateral;  Removal of nonfunctioning clip   TUBAL LIGATION      History reviewed. No pertinent family history.  Social History:  reports that she quit smoking about 9 years ago. Her smoking use included cigarettes. She has quit using smokeless tobacco. She reports  current alcohol use. She reports that she does not use drugs.  Allergies:  Allergies  Allergen Reactions   Fish Allergy Hives   Other     Apples, certain types, throat itchy   Penicillins Hives    No medications prior to admission.    Review of Systems  Constitutional:  Negative for fever.  Gastrointestinal:  Negative for nausea.  Genitourinary:  Positive for menstrual problem and pelvic pain.  Musculoskeletal:  Negative for back pain.   Height 5' 2.5" (1.588 m), weight 80.7 kg, last menstrual period 07/04/2020. Physical Exam Cardiovascular:     Rate and Rhythm: Normal rate and regular rhythm.  Pulmonary:     Effort: Pulmonary effort is normal.  Abdominal:     Palpations: Abdomen is soft.     Comments: Pfannenstiel and laparoscopic incision scars  Genitourinary:    General: Normal vulva.     Rectum: Normal.  Musculoskeletal:     Cervical back: Normal range of motion.  Neurological:     Mental Status: She is alert.  Psychiatric:        Mood and Affect: Mood normal.    No results found for this or any previous visit (from the past 24 hour(s)).  No results found.  Assessment/Plan: d/w pt laparoscopy in detail including procedure, and risks of bleeding , infection, and possible damage to bowel or bladder.  We discussed ovarian cystectomy vs oophorectomy depending on how much normal ovarian tissue was visible or if has  extensive endometriosis on the left adnexa and if so, she would like removed.  She would like both tubes removed as a means of sterilization and knows this is a permanent decision.  She has had a prior tubal but one clip fell off and tubal patency on that side was confirmed with an HSG.  She understands in the event of a complication she would need a larger incision and that this would delay her recovery.  We agreed on about 1 week out of work post-surgery. The patient was also counseled on the novasure procedure in detail.  The risks of bleeding and infection  and possible uterine perforation were reviewed.  We discussed that the procedure will usually reduce bleeding significantly, but may not eliminate periods.  We also discussed that she should not perform this procedure if she desires any future pregnancies.  She plans no future childbearing and is getting tubes removed with surgery.   [name] 07/26/2020, 1:46 PM

## 2020-07-27 ENCOUNTER — Encounter (HOSPITAL_BASED_OUTPATIENT_CLINIC_OR_DEPARTMENT_OTHER): Payer: Self-pay | Admitting: Obstetrics and Gynecology

## 2020-07-27 ENCOUNTER — Encounter (HOSPITAL_BASED_OUTPATIENT_CLINIC_OR_DEPARTMENT_OTHER)
Admission: RE | Disposition: A | Discharge status: Home / Self Care | Payer: Self-pay | Source: Home / Self Care | Attending: Obstetrics and Gynecology

## 2020-07-27 ENCOUNTER — Ambulatory Visit (HOSPITAL_BASED_OUTPATIENT_CLINIC_OR_DEPARTMENT_OTHER)
Admission: RE | Admit: 2020-07-27 | Discharge: 2020-07-27 | Disposition: A | Discharge status: Home / Self Care | Payer: 59 | Attending: Obstetrics and Gynecology | Admitting: Obstetrics and Gynecology

## 2020-07-27 ENCOUNTER — Ambulatory Visit (HOSPITAL_BASED_OUTPATIENT_CLINIC_OR_DEPARTMENT_OTHER): Payer: 59 | Admitting: Anesthesiology

## 2020-07-27 DIAGNOSIS — N736 Female pelvic peritoneal adhesions (postinfective): Secondary | ICD-10-CM | POA: Insufficient documentation

## 2020-07-27 DIAGNOSIS — R102 Pelvic and perineal pain: Secondary | ICD-10-CM | POA: Diagnosis not present

## 2020-07-27 DIAGNOSIS — N189 Chronic kidney disease, unspecified: Secondary | ICD-10-CM | POA: Diagnosis not present

## 2020-07-27 DIAGNOSIS — Z87891 Personal history of nicotine dependence: Secondary | ICD-10-CM | POA: Insufficient documentation

## 2020-07-27 DIAGNOSIS — Z91013 Allergy to seafood: Secondary | ICD-10-CM | POA: Diagnosis not present

## 2020-07-27 DIAGNOSIS — N920 Excessive and frequent menstruation with regular cycle: Secondary | ICD-10-CM | POA: Insufficient documentation

## 2020-07-27 DIAGNOSIS — N946 Dysmenorrhea, unspecified: Secondary | ICD-10-CM | POA: Diagnosis not present

## 2020-07-27 DIAGNOSIS — Z880 Allergy status to penicillin: Secondary | ICD-10-CM | POA: Insufficient documentation

## 2020-07-27 HISTORY — DX: Chronic kidney disease, unspecified: N18.9

## 2020-07-27 HISTORY — PX: DILITATION & CURRETTAGE/HYSTROSCOPY WITH NOVASURE ABLATION: SHX5568

## 2020-07-27 HISTORY — PX: LAPAROSCOPIC BILATERAL SALPINGECTOMY: SHX5889

## 2020-07-27 HISTORY — PX: LAPAROSCOPY: SHX197

## 2020-07-27 HISTORY — PX: FULGURATION OF LESION: SHX6726

## 2020-07-27 LAB — CBC
HCT: 41.6 % (ref 36.0–46.0)
Hemoglobin: 13.9 g/dL (ref 12.0–15.0)
MCH: 30.7 pg (ref 26.0–34.0)
MCHC: 33.4 g/dL (ref 30.0–36.0)
MCV: 91.8 fL (ref 80.0–100.0)
Platelets: 333 10*3/uL (ref 150–400)
RBC: 4.53 MIL/uL (ref 3.87–5.11)
RDW: 13.2 % (ref 11.5–15.5)
WBC: 5.8 10*3/uL (ref 4.0–10.5)
nRBC: 0 % (ref 0.0–0.2)

## 2020-07-27 LAB — POCT PREGNANCY, URINE: Preg Test, Ur: NEGATIVE

## 2020-07-27 SURGERY — LAPAROSCOPY OPERATIVE
Anesthesia: General | Site: Uterus

## 2020-07-27 MED ORDER — FENTANYL CITRATE (PF) 100 MCG/2ML IJ SOLN
INTRAMUSCULAR | Status: AC
  Filled 2020-07-27: qty 2

## 2020-07-27 MED ORDER — ROCURONIUM BROMIDE 10 MG/ML (PF) SYRINGE
PREFILLED_SYRINGE | INTRAVENOUS | Status: DC | PRN
  Administered 2020-07-27: 60 mg via INTRAVENOUS

## 2020-07-27 MED ORDER — ONDANSETRON HCL 4 MG/2ML IJ SOLN
INTRAMUSCULAR | Status: AC
  Filled 2020-07-27: qty 2

## 2020-07-27 MED ORDER — BUPIVACAINE HCL (PF) 0.25 % IJ SOLN
INTRAMUSCULAR | Status: DC | PRN
  Administered 2020-07-27: 20 mL

## 2020-07-27 MED ORDER — WHITE PETROLATUM EX OINT
TOPICAL_OINTMENT | CUTANEOUS | Status: AC
  Filled 2020-07-27: qty 5

## 2020-07-27 MED ORDER — LACTATED RINGERS IV SOLN: INTRAVENOUS | Status: DC

## 2020-07-27 MED ORDER — DEXAMETHASONE SODIUM PHOSPHATE 10 MG/ML IJ SOLN
INTRAMUSCULAR | Status: DC | PRN
  Administered 2020-07-27: 4 mg via INTRAVENOUS

## 2020-07-27 MED ORDER — LIDOCAINE HCL (PF) 2 % IJ SOLN
INTRAMUSCULAR | Status: DC | PRN
  Administered 2020-07-27: 100 mg via INTRADERMAL

## 2020-07-27 MED ORDER — ROCURONIUM BROMIDE 10 MG/ML (PF) SYRINGE
PREFILLED_SYRINGE | INTRAVENOUS | Status: AC
  Filled 2020-07-27: qty 10

## 2020-07-27 MED ORDER — OXYCODONE HCL 5 MG/5ML PO SOLN: 5.0000 mg | Freq: Once | ORAL | Status: DC | PRN

## 2020-07-27 MED ORDER — LIDOCAINE HCL 1 % IJ SOLN
INTRAMUSCULAR | Status: DC | PRN
  Administered 2020-07-27: 20 mL

## 2020-07-27 MED ORDER — OXYCODONE HCL 5 MG PO TABS: 5.0000 mg | ORAL_TABLET | Freq: Once | ORAL | Status: DC | PRN

## 2020-07-27 MED ORDER — FENTANYL CITRATE (PF) 100 MCG/2ML IJ SOLN
INTRAMUSCULAR | Status: DC | PRN
  Administered 2020-07-27 (×2): 25 ug via INTRAVENOUS
  Administered 2020-07-27: 100 ug via INTRAVENOUS
  Administered 2020-07-27: 50 ug via INTRAVENOUS

## 2020-07-27 MED ORDER — SODIUM CHLORIDE 0.9 % IR SOLN
Status: DC | PRN
  Administered 2020-07-27: 3000 mL

## 2020-07-27 MED ORDER — PROMETHAZINE HCL 25 MG/ML IJ SOLN: 6.2500 mg | INTRAMUSCULAR | Status: DC | PRN

## 2020-07-27 MED ORDER — DEXAMETHASONE SODIUM PHOSPHATE 10 MG/ML IJ SOLN
INTRAMUSCULAR | Status: AC
  Filled 2020-07-27: qty 1

## 2020-07-27 MED ORDER — SCOPOLAMINE 1 MG/3DAYS TD PT72
MEDICATED_PATCH | TRANSDERMAL | Status: AC
  Filled 2020-07-27: qty 1

## 2020-07-27 MED ORDER — DEXMEDETOMIDINE (PRECEDEX) IN NS 20 MCG/5ML (4 MCG/ML) IV SYRINGE
PREFILLED_SYRINGE | INTRAVENOUS | Status: DC | PRN
  Administered 2020-07-27 (×4): 4 ug via INTRAVENOUS

## 2020-07-27 MED ORDER — MIDAZOLAM HCL 2 MG/2ML IJ SOLN
INTRAMUSCULAR | Status: DC | PRN
  Administered 2020-07-27: 2 mg via INTRAVENOUS

## 2020-07-27 MED ORDER — SILVER NITRATE-POT NITRATE 75-25 % EX MISC
CUTANEOUS | Status: DC | PRN
  Administered 2020-07-27: 4

## 2020-07-27 MED ORDER — ACETAMINOPHEN 325 MG PO TABS
ORAL_TABLET | ORAL | Status: DC | PRN
  Administered 2020-07-27: 1000 mg via ORAL

## 2020-07-27 MED ORDER — LIDOCAINE HCL (PF) 2 % IJ SOLN
INTRAMUSCULAR | Status: AC
  Filled 2020-07-27: qty 5

## 2020-07-27 MED ORDER — ACETAMINOPHEN 500 MG PO TABS
ORAL_TABLET | ORAL | Status: AC
  Filled 2020-07-27: qty 2

## 2020-07-27 MED ORDER — KETOROLAC TROMETHAMINE 30 MG/ML IJ SOLN: 30.0000 mg | Freq: Once | INTRAMUSCULAR | Status: DC | PRN

## 2020-07-27 MED ORDER — PROPOFOL 10 MG/ML IV BOLUS
INTRAVENOUS | Status: DC | PRN
  Administered 2020-07-27: 200 mg via INTRAVENOUS

## 2020-07-27 MED ORDER — MIDAZOLAM HCL 2 MG/2ML IJ SOLN
INTRAMUSCULAR | Status: AC
  Filled 2020-07-27: qty 2

## 2020-07-27 MED ORDER — SUGAMMADEX SODIUM 200 MG/2ML IV SOLN
INTRAVENOUS | Status: DC | PRN
  Administered 2020-07-27: 160 mg via INTRAVENOUS

## 2020-07-27 MED ORDER — PROPOFOL 10 MG/ML IV BOLUS
INTRAVENOUS | Status: AC
  Filled 2020-07-27: qty 20

## 2020-07-27 MED ORDER — POVIDONE-IODINE 10 % EX SWAB: 2.0000 "application " | Freq: Once | CUTANEOUS | Status: DC

## 2020-07-27 MED ORDER — ONDANSETRON HCL 4 MG/2ML IJ SOLN
INTRAMUSCULAR | Status: DC | PRN
  Administered 2020-07-27: 4 mg via INTRAVENOUS

## 2020-07-27 MED ORDER — DEXMEDETOMIDINE (PRECEDEX) IN NS 20 MCG/5ML (4 MCG/ML) IV SYRINGE
PREFILLED_SYRINGE | INTRAVENOUS | Status: AC
  Filled 2020-07-27: qty 5

## 2020-07-27 MED ORDER — FENTANYL CITRATE (PF) 100 MCG/2ML IJ SOLN
25.0000 ug | INTRAMUSCULAR | Status: DC | PRN
  Administered 2020-07-27 (×2): 25 ug via INTRAVENOUS

## 2020-07-27 SURGICAL SUPPLY — 46 items
ABLATOR SURESOUND NOVASURE (ABLATOR) ×4 IMPLANT
ADH SKN CLS APL DERMABOND .7 (GAUZE/BANDAGES/DRESSINGS) ×6
APL SWBSTK 6 STRL LF DISP (MISCELLANEOUS) ×3
APPLICATOR COTTON TIP 6 STRL (MISCELLANEOUS) ×3 IMPLANT
APPLICATOR COTTON TIP 6IN STRL (MISCELLANEOUS) ×4
BAG RETRIEVAL 10 (BASKET)
BALLN HASSAN TROCAR (MISCELLANEOUS) ×4 IMPLANT
CABLE HIGH FREQUENCY MONO STRZ (ELECTRODE) IMPLANT
CATH ROBINSON RED A/P 16FR (CATHETERS) ×4 IMPLANT
DERMABOND ADVANCED (GAUZE/BANDAGES/DRESSINGS) ×2
DERMABOND ADVANCED .7 DNX12 (GAUZE/BANDAGES/DRESSINGS) ×6 IMPLANT
DRSG COVADERM PLUS 2X2 (GAUZE/BANDAGES/DRESSINGS) IMPLANT
DRSG OPSITE POSTOP 3X4 (GAUZE/BANDAGES/DRESSINGS) IMPLANT
DURAPREP 26ML APPLICATOR (WOUND CARE) ×4 IMPLANT
ELECT REM PT RETURN 9FT ADLT (ELECTROSURGICAL) ×4
ELECTRODE REM PT RTRN 9FT ADLT (ELECTROSURGICAL) ×3 IMPLANT
GAUZE 4X4 16PLY ~~LOC~~+RFID DBL (SPONGE) ×8 IMPLANT
GLOVE SURG ENC MOIS LTX SZ6.5 (GLOVE) ×8 IMPLANT
GOWN STRL REUS W/TWL LRG LVL3 (GOWN DISPOSABLE) ×8 IMPLANT
IV NS 500ML (IV SOLUTION) ×4
IV NS 500ML BAXH (IV SOLUTION) ×3 IMPLANT
IV NS IRRIG 3000ML ARTHROMATIC (IV SOLUTION) ×4 IMPLANT
KIT PROCEDURE FLUENT (KITS) ×4 IMPLANT
KIT TURNOVER CYSTO (KITS) ×4 IMPLANT
NEEDLE INSUFFLATION 120MM (ENDOMECHANICALS) IMPLANT
NS IRRIG 500ML POUR BTL (IV SOLUTION) ×8 IMPLANT
PACK LAPAROSCOPY BASIN (CUSTOM PROCEDURE TRAY) ×4 IMPLANT
PACK TRENDGUARD 450 HYBRID PRO (MISCELLANEOUS) ×3 IMPLANT
PACK VAGINAL MINOR WOMEN LF (CUSTOM PROCEDURE TRAY) ×4 IMPLANT
PAD OB MATERNITY 4.3X12.25 (PERSONAL CARE ITEMS) ×4 IMPLANT
PROTECTOR NERVE ULNAR (MISCELLANEOUS) ×8 IMPLANT
SEAL ROD LENS SCOPE MYOSURE (ABLATOR) ×4 IMPLANT
SET SUCTION IRRIG HYDROSURG (IRRIGATION / IRRIGATOR) IMPLANT
SET TRI-LUMEN FLTR TB AIRSEAL (TUBING) ×4 IMPLANT
SHEARS 1100 HARMONIC 36 (ELECTROSURGICAL) ×4 IMPLANT
SUT VIC AB 4-0 PS2 18 (SUTURE) ×12 IMPLANT
SUT VICRYL 0 UR6 27IN ABS (SUTURE) ×8 IMPLANT
SYS BAG RETRIEVAL 10MM (BASKET)
SYSTEM BAG RETRIEVAL 10MM (BASKET) IMPLANT
TOWEL OR 17X26 10 PK STRL BLUE (TOWEL DISPOSABLE) ×4 IMPLANT
TRENDGUARD 450 HYBRID PRO PACK (MISCELLANEOUS) ×4
TROCAR BALLN 12MMX100 BLUNT (TROCAR) IMPLANT
TROCAR BLADELESS OPT 5 100 (ENDOMECHANICALS) ×4 IMPLANT
TROCAR PORT AIRSEAL 5X120 (TROCAR) IMPLANT
TROCAR XCEL NON-BLD 11X100MML (ENDOMECHANICALS) IMPLANT
WARMER LAPAROSCOPE (MISCELLANEOUS) ×4 IMPLANT

## 2020-07-27 NOTE — Op Note (Signed)
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  The left tube had a residual filshie clip.  The uterus was partially adherent to the side wall on the right with a band of tissue and this was taken down.  The distal ends of the tubes were scarred to the sidewalls bilaterally.  The cul-de-sac was clear and there was no obvious endometriosis. Appendix and liver appeared normal.  Specimen Fallopian tube segments in pieces  Procedure Note  Patient was taken to the operating room where general anesthesia was obtained without difficulty she was prepped and draped in the normal sterile fashion in the dorsal lithotomy position. An appropriate time out was performed. A Hulka tenaculum was placed within cervix and a Foley catheter in the bladder. Attention was then turned to the abdomen where of infraumbilical incision was made after injection with quarter percent Marcaine approximate 2 cm in width. The fascia was then identified and grasped with Coker clamps and elevated. The fascia was then opened sharply with Mayo scissors and intraperitoneal placement of the Roseanne Reno was performed, after a pursestring suture of 0 Vicryl was placed around the fascial edge. With the [facility] in place the pneumoperitoneum was obtained and the pelvis and abdomen inspected with findings as previously stated. Two additional 5 mm ports were placed under direct visualization from the lateral upper  quadrants. Each port site was injected with quarter percent Marcaine prior placement.    The pelvis was inspected with the findings as above.The atraumatic grasper was used to lift the tube away from the sidewall and uterus and the harmonic scalpel to dissect the tubes free bilaterally and take down uterine adhesions.   The tubes appeared disjointed, especially on the right, but it did seem that we were able to remove the entirety of the tube.  Most of the uterine adhesions were freed as well. The ovaries appeared totally normal and were left in place. The remainder of the pelvis and abdomen were inspected and found to be normal.  The 5 mm ports were removed under direct visualization.  The pursestring of the umbilical port was then closed with no defect noted while directly visualizing with the camera.   The umbilical incision was closed with a deep suture of 2-0 vicryl to close the subcutaneous space.  All skin incisions were closed with 4-0 vicryl in a subcuticular stitch and dermabond.   Attention was turned to the vagina. A speculum was then placed within the vagina and the anterior lip of the cervix identified and injected with approximately 2 cc of 1% plain lidocaine. An additional 9 cc each was placed at 2 and 10:00 for a paracervical block. Uterus was then sounded to 9 cm and the cervical length measured at 3.5 cm.  The Pratt dilators utilized to dilate the cervix. The hysteroscope was introduced into the cavity and the findings noted as previously stated.  The hysteroscope was then removed and the Novasure device inserted to the top of the fundus and deployed.  A cavity width of 3.0 noted.  The device was activated  with a treatment time of 95 secs. The hysteroscope was then replaced and the cavity noted to have a good treatment effect with blanching and no viable endometrium apparent.  There was slightly pink tissue in the upper right cornua, but was less than a cm. All instruments were removed from  the vagina.  The tenaculum site was rendered hemostatic with silver nitrate.  Finally the speculum was removed from the vagina and the patient awakened and taken to the recovery room in good condition.

## 2020-07-27 NOTE — Transfer of Care (Deleted)
  Last Vitals:  Vitals Value Taken Time  BP 123/86 07/27/20 1230  Temp 36.7 C 07/27/20 1230  Pulse 79 07/27/20 1230  Resp 16 07/27/20 1230  SpO2 100 % 07/27/20 1230    Last Pain:  Vitals:   07/27/20 1230  TempSrc:   PainSc: 2       Patients Stated Pain Goal: 3 (07/27/20 1145)  Complications: No notable events documented.

## 2020-07-27 NOTE — Interval H&P Note (Signed)
History and Physical Interval Note:  07/27/2020 7:43 AM  Joanne Mcknight  has presented today for surgery, with the diagnosis of sterilization pain in pelvis.  The various methods of treatment have been discussed with the patient and family. After consideration of risks, benefits and other options for treatment, the patient has consented to  Procedure(s) with comments: LAPAROSCOPY OPERATIVE (N/A) - hassan trocar and harmonic scalpel LAPAROSCOPIC BILATERAL SALPINGECTOMY (Bilateral) POSSIBLE FULGURATION OF LESION, ENDOMETRIOSIS (N/A) DILATATION & CURETTAGE/HYSTEROSCOPY WITH NOVASURE ABLATION (N/A) as a surgical intervention.  The patient's history has been reviewed, patient examined, no change in status, stable for surgery.  I have reviewed the patient's chart and labs.  Questions were answered to the patient's satisfaction.     [name]

## 2020-07-27 NOTE — Anesthesia Procedure Notes (Signed)
Procedure Name: Intubation Date/Time: 07/27/2020 9:09 AM Performed by: Justice Rocher, CRNA Pre-anesthesia Checklist: Patient identified, Emergency Drugs available, Suction available, Patient being monitored and Timeout performed Patient Re-evaluated:Patient Re-evaluated prior to induction Oxygen Delivery Method: Circle system utilized Preoxygenation: Pre-oxygenation with 100% oxygen Induction Type: IV induction Ventilation: Mask ventilation without difficulty Laryngoscope Size: Mac and 4 Grade View: Grade II Tube type: Oral Tube size: 7.0 mm Number of attempts: 1 Airway Equipment and Method: Stylet and Oral airway Placement Confirmation: ETT inserted through vocal cords under direct vision, positive ETCO2, breath sounds checked- equal and bilateral and CO2 detector Secured at: 23 cm Tube secured with: Tape Dental Injury: Teeth and Oropharynx as per pre-operative assessment

## 2020-07-27 NOTE — Discharge Instructions (Addendum)
Post Anesthesia Home Care Instructions  Activity: Get plenty of rest for the remainder of the day. A responsible individual must stay with you for 24 hours following the procedure.  For the next 24 hours, DO NOT: -Drive a car -Operate machinery -Drink alcoholic beverages -Take any medication unless instructed by your physician -Make any legal decisions or sign important papers.  Meals: Start with liquid foods such as gelatin or soup. Progress to regular foods as tolerated. Avoid greasy, spicy, heavy foods. If nausea and/or vomiting occur, drink only clear liquids until the nausea and/or vomiting subsides. Call your physician if vomiting continues.  Special Instructions/Symptoms: Your throat may feel dry or sore from the anesthesia or the breathing tube placed in your throat during surgery. If this causes discomfort, gargle with warm salt water. The discomfort should disappear within 24 hours.  If you had a scopolamine patch placed behind your ear for the management of post- operative nausea and/or vomiting:  1. The medication in the patch is effective for 72 hours, after which it should be removed.  Wrap patch in a tissue and discard in the trash. Wash hands thoroughly with soap and water. 2. You may remove the patch earlier than 72 hours if you experience unpleasant side effects which may include dry mouth, dizziness or visual disturbances. 3. Avoid touching the patch. Wash your hands with soap and water after contact with the patch.    DISCHARGE INSTRUCTIONS: Laparoscopy  The following instructions have been prepared to help you care for yourself upon your return home today.  Wound care: . Do not get the incision wet for the first 24 hours. The incision should be kept clean and dry. . The Band-Aids or dressings may be removed the day after surgery. . Should the incision become sore, red, and swollen after the first week, check with your doctor.  Personal hygiene: . Shower the day  after your procedure.  Activity and limitations: . Do NOT drive or operate any equipment today. . Do NOT lift anything more than 15 pounds for 2-3 weeks after surgery. . Do NOT rest in bed all day. . Walking is encouraged. Walk each day, starting slowly with 5-minute walks 3 or 4 times a day. Slowly increase the length of your walks. . Walk up and down stairs slowly. . Do NOT do strenuous activities, such as golfing, playing tennis, bowling, running, biking, weight lifting, gardening, mowing, or vacuuming for 2-4 weeks. Ask your doctor when it is okay to start.  Diet: Eat a light meal as desired this evening. You may resume your usual diet tomorrow.  Return to work: This is dependent on the type of work you do. For the most part you can return to a desk job within a week of surgery. If you are more active at work, please discuss this with your doctor.  What to expect after your surgery: You may have a slight burning sensation when you urinate on the first day. You may have a very small amount of blood in the urine. Expect to have a small amount of vaginal discharge/light bleeding for 1-2 weeks. It is not unusual to have abdominal soreness and bruising for up to 2 weeks. You may be tired and need more rest for about 1 week. You may experience shoulder pain for 24-72 hours. Lying flat in bed may relieve it.  Call your doctor for any of the following: . Develop a fever of 100.4 or greater . Inability to urinate 6 hours after   discharge from hospital . Severe pain not relieved by pain medications . Persistent of heavy bleeding at incision site . Redness or swelling around incision site after a week . Increasing nausea or vomiting  Patient Signature________________________________________ Nurse Signature_________________________________________ 

## 2020-07-27 NOTE — Transfer of Care (Signed)
Immediate Anesthesia Transfer of Care Note  Patient: Joanne Mcknight  Procedure(s) Performed: Procedure(s) (LRB): LAPAROSCOPY OPERATIVE (N/A) LAPAROSCOPIC BILATERAL SALPINGECTOMY (Bilateral) LYSIS OF ADHESIONS (N/A) DILATATION & CURETTAGE/HYSTEROSCOPY WITH NOVASURE ABLATION (N/A)  Patient Location: PACU  Anesthesia Type: General  Level of Consciousness: awake, oriented, sedated and patient cooperative  Airway & Oxygen Therapy: Patient Spontanous Breathing and Patient connected to face mask oxygen  Post-op Assessment: Report given to PACU RN and Post -op Vital signs reviewed and stable  Post vital signs: Reviewed and stable  Complications: No apparent anesthesia complications Last Vitals:  Vitals Value Taken Time  BP 123/86 07/27/20 1230  Temp 36.7 C 07/27/20 1230  Pulse 79 07/27/20 1230  Resp 16 07/27/20 1230  SpO2 100 % 07/27/20 1230    Last Pain:  Vitals:   07/27/20 1230  TempSrc:   PainSc: 2       Patients Stated Pain Goal: 3 (07/27/20 1145)  Complications: No notable events documented.

## 2020-07-27 NOTE — Anesthesia Preprocedure Evaluation (Signed)
Anesthesia Evaluation  Patient identified by MRN, date of birth, ID band Patient awake    Reviewed: Allergy & Precautions, NPO status , Patient's Chart, lab work & pertinent test results  Airway Mallampati: II  TM Distance: >3 FB Neck ROM: Full    Dental no notable dental hx.    Pulmonary asthma , former smoker,    Pulmonary exam normal breath sounds clear to auscultation       Cardiovascular negative cardio ROS Normal cardiovascular exam Rhythm:Regular Rate:Normal     Neuro/Psych negative neurological ROS  negative psych ROS   GI/Hepatic negative GI ROS, Neg liver ROS,   Endo/Other  negative endocrine ROS  Renal/GU negative Renal ROS  negative genitourinary   Musculoskeletal negative musculoskeletal ROS (+)   Abdominal   Peds negative pediatric ROS (+)  Hematology negative hematology ROS (+)   Anesthesia Other Findings   Reproductive/Obstetrics negative OB ROS                             Anesthesia Physical Anesthesia Plan  ASA: 2  Anesthesia Plan: General   Post-op Pain Management:    Induction: Intravenous  PONV Risk Score and Plan: 4 or greater and Ondansetron, Dexamethasone, Midazolam, Treatment may vary due to age or medical condition and Scopolamine patch - Pre-op  Airway Management Planned: Oral ETT  Additional Equipment:   Intra-op Plan:   Post-operative Plan: Extubation in OR  Informed Consent: I have reviewed the patients History and Physical, chart, labs and discussed the procedure including the risks, benefits and alternatives for the proposed anesthesia with the patient or authorized representative who has indicated his/her understanding and acceptance.     Dental advisory given  Plan Discussed with: CRNA and Surgeon  Anesthesia Plan Comments:         Anesthesia Quick Evaluation

## 2020-07-28 LAB — SURGICAL PATHOLOGY

## 2020-07-31 ENCOUNTER — Encounter (HOSPITAL_BASED_OUTPATIENT_CLINIC_OR_DEPARTMENT_OTHER): Payer: Self-pay | Admitting: Obstetrics and Gynecology

## 2020-08-01 NOTE — Anesthesia Postprocedure Evaluation (Signed)
Anesthesia Post Note  Patient: Joanne Mcknight  Procedure(s) Performed: LAPAROSCOPY OPERATIVE (Abdomen) LAPAROSCOPIC BILATERAL SALPINGECTOMY (Bilateral: Abdomen) LYSIS OF ADHESIONS (Abdomen) DILATATION & CURETTAGE/HYSTEROSCOPY WITH NOVASURE ABLATION (Uterus)     Patient location during evaluation: PACU Anesthesia Type: General Level of consciousness: awake and alert Pain management: pain level controlled Vital Signs Assessment: post-procedure vital signs reviewed and stable Respiratory status: spontaneous breathing, nonlabored ventilation, respiratory function stable and patient connected to nasal cannula oxygen Cardiovascular status: blood pressure returned to baseline and stable Postop Assessment: no apparent nausea or vomiting Anesthetic complications: no   No notable events documented.  Last Vitals:  Vitals:   07/27/20 1145 07/27/20 1230  BP: 138/86 123/86  Pulse: 72 79  Resp: 17 16  Temp:  36.7 C  SpO2: 100% 100%    Last Pain:  Vitals:   07/28/20 1030  TempSrc:   PainSc: 2                  Rhapsody Wolven S

## 2023-07-30 ENCOUNTER — Other Ambulatory Visit: Payer: Self-pay | Admitting: Gynecology

## 2023-07-30 DIAGNOSIS — N6325 Unspecified lump in the left breast, overlapping quadrants: Secondary | ICD-10-CM

## 2023-08-04 ENCOUNTER — Ambulatory Visit
Admission: RE | Admit: 2023-08-04 | Discharge: 2023-08-04 | Disposition: A | Discharge status: Home / Self Care | Source: Ambulatory Visit | Attending: Gynecology | Admitting: Gynecology

## 2023-08-04 DIAGNOSIS — N6325 Unspecified lump in the left breast, overlapping quadrants: Secondary | ICD-10-CM
